# Patient Record
Sex: Male | Born: 1965 | Race: White | Hispanic: No | Marital: Married | Smoking: Never smoker
Health system: Southern US, Community
[De-identification: ages and names within clinical notes are randomized; demographics above are authoritative.]

## PROBLEM LIST (undated history)

## (undated) DIAGNOSIS — N200 Calculus of kidney: Secondary | ICD-10-CM

## (undated) DIAGNOSIS — E119 Type 2 diabetes mellitus without complications: Secondary | ICD-10-CM

## (undated) DIAGNOSIS — F419 Anxiety disorder, unspecified: Secondary | ICD-10-CM

## (undated) HISTORY — PX: VASECTOMY: SHX75

---

## 2000-09-02 ENCOUNTER — Other Ambulatory Visit: Admission: RE | Admit: 2000-09-02 | Discharge: 2000-09-02 | Payer: Self-pay | Admitting: Family Medicine

## 2008-02-21 ENCOUNTER — Emergency Department (HOSPITAL_COMMUNITY): Admission: EM | Admit: 2008-02-21 | Discharge: 2008-02-21 | Payer: Self-pay | Admitting: Emergency Medicine

## 2011-09-04 ENCOUNTER — Encounter: Payer: Self-pay | Admitting: Emergency Medicine

## 2011-09-04 ENCOUNTER — Emergency Department (INDEPENDENT_AMBULATORY_CARE_PROVIDER_SITE_OTHER): Payer: BC Managed Care – PPO

## 2011-09-04 ENCOUNTER — Emergency Department (HOSPITAL_BASED_OUTPATIENT_CLINIC_OR_DEPARTMENT_OTHER)
Admission: EM | Admit: 2011-09-04 | Discharge: 2011-09-04 | Disposition: A | Discharge status: Home / Self Care | Payer: BC Managed Care – PPO | Attending: Emergency Medicine | Admitting: Emergency Medicine

## 2011-09-04 ENCOUNTER — Other Ambulatory Visit: Payer: Self-pay

## 2011-09-04 DIAGNOSIS — J45909 Unspecified asthma, uncomplicated: Secondary | ICD-10-CM | POA: Insufficient documentation

## 2011-09-04 DIAGNOSIS — R5381 Other malaise: Secondary | ICD-10-CM

## 2011-09-04 DIAGNOSIS — E1169 Type 2 diabetes mellitus with other specified complication: Secondary | ICD-10-CM | POA: Insufficient documentation

## 2011-09-04 DIAGNOSIS — R002 Palpitations: Secondary | ICD-10-CM | POA: Insufficient documentation

## 2011-09-04 DIAGNOSIS — R5383 Other fatigue: Secondary | ICD-10-CM

## 2011-09-04 DIAGNOSIS — R739 Hyperglycemia, unspecified: Secondary | ICD-10-CM

## 2011-09-04 DIAGNOSIS — R000 Tachycardia, unspecified: Secondary | ICD-10-CM

## 2011-09-04 LAB — BASIC METABOLIC PANEL
BUN: 16 mg/dL (ref 6–23)
CO2: 26 mEq/L (ref 19–32)
Calcium: 10.3 mg/dL (ref 8.4–10.5)
Chloride: 100 mEq/L (ref 96–112)
Creatinine, Ser: 1 mg/dL (ref 0.50–1.35)
GFR calc Af Amer: >60 mL/min (ref >60)
GFR calc non Af Amer: >60 mL/min (ref >60)
Glucose, Bld: 289 mg/dL — ABNORMAL HIGH (ref 70–99)
Potassium: 3.8 mEq/L (ref 3.5–5.1)
Sodium: 139 mEq/L (ref 135–145)

## 2011-09-04 LAB — CBC
HCT: 44.1 % (ref 39.0–52.0)
Hemoglobin: 15.6 g/dL (ref 13.0–17.0)
MCH: 32 pg (ref 26.0–34.0)
MCHC: 35.4 g/dL (ref 30.0–36.0)
MCV: 90.4 fL (ref 78.0–100.0)
Platelets: 236 10*3/uL (ref 150–400)
RBC: 4.88 MIL/uL (ref 4.22–5.81)
RDW: 12.2 % (ref 11.5–15.5)
WBC: 6.4 10*3/uL (ref 4.0–10.5)

## 2011-09-04 LAB — DIFFERENTIAL
Basophils Absolute: 0.1 10*3/uL (ref 0.0–0.1)
Basophils Relative: 1 % (ref 0–1)
Eosinophils Absolute: 0.2 10*3/uL (ref 0.0–0.7)
Eosinophils Relative: 3 % (ref 0–5)
Lymphocytes Relative: 20 % (ref 12–46)
Lymphs Abs: 1.3 10*3/uL (ref 0.7–4.0)
Monocytes Absolute: 0.6 10*3/uL (ref 0.1–1.0)
Monocytes Relative: 10 % (ref 3–12)
Neutro Abs: 4.2 10*3/uL (ref 1.7–7.7)
Neutrophils Relative %: 66 % (ref 43–77)

## 2011-09-04 LAB — CARDIAC PANEL(CRET KIN+CKTOT+MB+TROPI)
Relative Index: 2.2 (ref 0.0–2.5)
Troponin I: <0.3 ng/mL (ref <0.30)

## 2011-09-04 NOTE — ED Provider Notes (Signed)
History     CSN: [id number] Arrival date & time: 09/04/2011  4:59 PM   Chief Complaint  Patient presents with  . Tachycardia     (Include location/radiation/quality/duration/timing/severity/associated sxs/prior treatment) HPI Comments: Pt states that he one episode of rapid heart rate followed by entire body flushing:pt states he then had 2 more episodes of flushing since  Patient is a 45 y.o. male presenting with palpitations. The history is provided by the patient. No language interpreter was used.  Palpitations  This is a new problem. The current episode started 3 to 5 hours ago. The problem occurs rarely. The problem has been resolved. The problem is associated with an unknown factor. On average, each episode lasts 30 seconds. Associated symptoms include weakness. Pertinent negatives include no diaphoresis, no fever, no chest pain, no chest pressure, no syncope, no abdominal pain, no cough, no shortness of breath and no sputum production. Associated symptoms comments: Pt states that he had entire body flushing. He has tried nothing for the symptoms. The treatment provided no relief. Risk factors include no known risk factors.     Past Medical History  Diagnosis Date  . Diabetes mellitus   . Asthma      Past Surgical History  Procedure Date  . Vasectomy     No family history on file.  History  Substance Use Topics  . Smoking status: Never Smoker   . Smokeless tobacco: Not on file  . Alcohol Use: No      Review of Systems  Constitutional: Negative for fever and diaphoresis.  Respiratory: Negative for cough, sputum production and shortness of breath.   Cardiovascular: Positive for palpitations. Negative for chest pain and syncope.  Gastrointestinal: Negative for abdominal pain.  Neurological: Positive for weakness.  All other systems reviewed and are negative.    Allergies  Review of patient's allergies indicates no known allergies.  Home Medications    Current Outpatient Rx  Name Route Sig Dispense Refill  . BUDESONIDE-FORMOTEROL FUMARATE 80-4.5 MCG/ACT IN AERO Inhalation Inhale 2 puffs into the lungs 2 (two) times daily.      Marland Kitchen METFORMIN HCL 1000 MG PO TABS Oral Take 1,000 mg by mouth daily.        Physical Exam    BP 140/80  Pulse 91  Temp(Src) 97.8 F (36.6 C) (Oral)  Resp 18  Ht 5\' 10"  (1.778 m)  Wt 230 lb (104.327 kg)  BMI 33.00 kg/m2  SpO2 98%  Physical Exam  Nursing note and vitals reviewed. Constitutional: He appears well-developed and well-nourished.  HENT:  Head: Normocephalic and atraumatic.  Eyes: Pupils are equal, round, and reactive to light.  Neck: Normal range of motion.  Cardiovascular: Normal rate and regular rhythm.   Pulmonary/Chest: Effort normal and breath sounds normal.  Abdominal: Soft.  Musculoskeletal: Normal range of motion.  Neurological: He is alert.  Skin: Skin is warm and dry.  Psychiatric: He has a normal mood and affect.    ED Course  Procedures  Results for orders placed during the hospital encounter of 09/04/11  BASIC METABOLIC PANEL      Component Value Range   Sodium 139  135 - 145 (mEq/L)   Potassium 3.8  3.5 - 5.1 (mEq/L)   Chloride 100  96 - 112 (mEq/L)   CO2 26  19 - 32 (mEq/L)   Glucose, Bld 289 (*) 70 - 99 (mg/dL)   BUN 16  6 - 23 (mg/dL)   Creatinine, Ser 1.47  0.50 - 1.35 (mg/dL)  Calcium 10.3  8.4 - 10.5 (mg/dL)   GFR calc non Af Amer >60  >60 (mL/min)   GFR calc Af Amer >60  >60 (mL/min)  CBC      Component Value Range   WBC 6.4  4.0 - 10.5 (K/uL)   RBC 4.88  4.22 - 5.81 (MIL/uL)   Hemoglobin 15.6  13.0 - 17.0 (g/dL)   HCT 16.1  09.6 - 04.5 (%)   MCV 90.4  78.0 - 100.0 (fL)   MCH 32.0  26.0 - 34.0 (pg)   MCHC 35.4  30.0 - 36.0 (g/dL)   RDW 40.9  81.1 - 91.4 (%)   Platelets 236  150 - 400 (K/uL)  DIFFERENTIAL      Component Value Range   Neutrophils Relative 66  43 - 77 (%)   Neutro Abs 4.2  1.7 - 7.7 (K/uL)   Lymphocytes Relative 20  12 - 46 (%)    Lymphs Abs 1.3  0.7 - 4.0 (K/uL)   Monocytes Relative 10  3 - 12 (%)   Monocytes Absolute 0.6  0.1 - 1.0 (K/uL)   Eosinophils Relative 3  0 - 5 (%)   Eosinophils Absolute 0.2  0.0 - 0.7 (K/uL)   Basophils Relative 1  0 - 1 (%)   Basophils Absolute 0.1  0.0 - 0.1 (K/uL)  CARDIAC PANEL(CRET KIN+CKTOT+MB+TROPI)      Component Value Range   Total CK 100  7 - 232 (U/L)   CK, MB 2.2  0.3 - 4.0 (ng/mL)   Troponin I <0.30  <0.30 (ng/mL)   Relative Index 2.2  0.0 - 2.5    Dg Chest 2 View  09/04/2011  *RADIOLOGY REPORT*  Clinical Data: 45 year old male with tachycardia, palpitations and weakness.  CHEST - 2 VIEW  Comparison: None  Findings: The cardiomediastinal silhouette is unremarkable. There is no evidence of focal airspace disease, pulmonary edema, pulmonary nodule/mass, pleural effusion, or pneumothorax. No acute bony abnormalities are identified.  IMPRESSION: No evidence of active cardiopulmonary disease.  Original Report Authenticated By: Rosendo Gros, M.D.    Date: 09/04/2011  Rate: 82  Rhythm: normal sinus rhythm  QRS Axis: normal  Intervals: normal  ST/T Wave abnormalities: normal  Conduction Disutrbances:none  Narrative Interpretation:   Old EKG Reviewed: none available   MDM No acute findings:discussed blood sugar with pt;pt is not having symptoms at this time:discussed following up with pcp or cardiology for possible holter monitor for continued symptoms       Teressa Lower, NP 09/04/11 1908

## 2011-09-04 NOTE — ED Provider Notes (Signed)
Medical screening examination/treatment/procedure(s) were performed by non-physician practitioner and as supervising physician I was immediately available for consultation/collaboration.   Charles B. Bernette Mayers, MD 09/04/11 1910

## 2011-09-04 NOTE — ED Notes (Signed)
Pt c/o rapid HR while getting haircut today; reports weakness in all extremities, nausea. Denies pain.

## 2011-09-09 LAB — URINALYSIS, ROUTINE W REFLEX MICROSCOPIC
Nitrite: NEGATIVE
Specific Gravity, Urine: 1.033 — ABNORMAL HIGH
Urobilinogen, UA: 0.2
pH: 5.5

## 2011-09-09 LAB — CBC: WBC: 10.8 — ABNORMAL HIGH

## 2011-09-09 LAB — BASIC METABOLIC PANEL
BUN: 19
CO2: 23
Chloride: 106
Creatinine, Ser: 1.32
Glucose, Bld: 208 — ABNORMAL HIGH
Potassium: 4.1

## 2011-09-09 LAB — URINE MICROSCOPIC-ADD ON

## 2011-09-09 LAB — DIFFERENTIAL
Lymphocytes Relative: 24
Monocytes Absolute: 0.5
Monocytes Relative: 5
Neutro Abs: 7.5

## 2011-10-11 ENCOUNTER — Other Ambulatory Visit: Payer: Self-pay | Admitting: Family Medicine

## 2011-10-11 ENCOUNTER — Ambulatory Visit
Admission: RE | Admit: 2011-10-11 | Discharge: 2011-10-11 | Disposition: A | Discharge status: Home / Self Care | Payer: BC Managed Care – PPO | Source: Ambulatory Visit | Attending: Family Medicine | Admitting: Family Medicine

## 2011-10-11 DIAGNOSIS — M79669 Pain in unspecified lower leg: Secondary | ICD-10-CM

## 2011-12-17 HISTORY — PX: EXTRACORPOREAL SHOCK WAVE LITHOTRIPSY: SHX1557

## 2011-12-17 HISTORY — PX: CYSTOSCOPY WITH STENT PLACEMENT: SHX5790

## 2012-07-06 DIAGNOSIS — E119 Type 2 diabetes mellitus without complications: Secondary | ICD-10-CM | POA: Insufficient documentation

## 2012-07-06 DIAGNOSIS — N200 Calculus of kidney: Secondary | ICD-10-CM | POA: Insufficient documentation

## 2012-07-06 DIAGNOSIS — J45909 Unspecified asthma, uncomplicated: Secondary | ICD-10-CM | POA: Insufficient documentation

## 2012-07-06 DIAGNOSIS — N201 Calculus of ureter: Secondary | ICD-10-CM | POA: Insufficient documentation

## 2012-07-06 NOTE — ED Notes (Signed)
Pt. Reports he usually takes flowmax and tonight he vomited up the medicine.

## 2012-07-07 ENCOUNTER — Emergency Department (HOSPITAL_BASED_OUTPATIENT_CLINIC_OR_DEPARTMENT_OTHER): Payer: BC Managed Care – PPO

## 2012-07-07 ENCOUNTER — Encounter (HOSPITAL_BASED_OUTPATIENT_CLINIC_OR_DEPARTMENT_OTHER): Payer: Self-pay | Admitting: *Deleted

## 2012-07-07 ENCOUNTER — Emergency Department (HOSPITAL_BASED_OUTPATIENT_CLINIC_OR_DEPARTMENT_OTHER)
Admission: EM | Admit: 2012-07-07 | Discharge: 2012-07-07 | Disposition: A | Discharge status: Home / Self Care | Payer: BC Managed Care – PPO | Attending: Emergency Medicine | Admitting: Emergency Medicine

## 2012-07-07 DIAGNOSIS — N201 Calculus of ureter: Secondary | ICD-10-CM

## 2012-07-07 DIAGNOSIS — N200 Calculus of kidney: Secondary | ICD-10-CM

## 2012-07-07 DIAGNOSIS — N23 Unspecified renal colic: Secondary | ICD-10-CM

## 2012-07-07 LAB — CBC WITH DIFFERENTIAL/PLATELET
Basophils Relative: 1 % (ref 0–1)
Eosinophils Absolute: 0.5 10*3/uL (ref 0.0–0.7)
HCT: 44.1 % (ref 39.0–52.0)
Hemoglobin: 15.2 g/dL (ref 13.0–17.0)
MCH: 31.7 pg (ref 26.0–34.0)
MCHC: 34.5 g/dL (ref 30.0–36.0)
Monocytes Absolute: 0.8 10*3/uL (ref 0.1–1.0)
Monocytes Relative: 9 % (ref 3–12)
Neutro Abs: 4.7 10*3/uL (ref 1.7–7.7)

## 2012-07-07 LAB — URINE MICROSCOPIC-ADD ON

## 2012-07-07 LAB — BASIC METABOLIC PANEL
BUN: 19 mg/dL (ref 6–23)
Chloride: 101 mEq/L (ref 96–112)
Creatinine, Ser: 1.3 mg/dL (ref 0.50–1.35)
GFR calc Af Amer: 75 mL/min — ABNORMAL LOW (ref >90)
Glucose, Bld: 192 mg/dL — ABNORMAL HIGH (ref 70–99)

## 2012-07-07 LAB — URINALYSIS, ROUTINE W REFLEX MICROSCOPIC
Bilirubin Urine: NEGATIVE
Ketones, ur: NEGATIVE mg/dL
Nitrite: NEGATIVE
pH: 6 (ref 5.0–8.0)

## 2012-07-07 MED ORDER — HYDROMORPHONE HCL PF 1 MG/ML IJ SOLN
1.0000 mg | Freq: Once | INTRAMUSCULAR | Status: AC
  Administered 2012-07-07: 1 mg via INTRAVENOUS
  Filled 2012-07-07: qty 1

## 2012-07-07 MED ORDER — ONDANSETRON HCL 4 MG/2ML IJ SOLN
INTRAMUSCULAR | Status: AC
  Administered 2012-07-07: 4 mg
  Filled 2012-07-07: qty 2

## 2012-07-07 MED ORDER — KETOROLAC TROMETHAMINE 30 MG/ML IJ SOLN
30.0000 mg | Freq: Once | INTRAMUSCULAR | Status: AC
  Administered 2012-07-07: 30 mg via INTRAVENOUS
  Filled 2012-07-07: qty 1

## 2012-07-07 MED ORDER — TAMSULOSIN HCL 0.4 MG PO CAPS: 0.4000 mg | ORAL_CAPSULE | Freq: Every day | ORAL | Status: DC

## 2012-07-07 MED ORDER — SODIUM CHLORIDE 0.9 % IV SOLN
Freq: Once | INTRAVENOUS | Status: AC
  Administered 2012-07-07: 1000 mL via INTRAVENOUS

## 2012-07-07 MED ORDER — ONDANSETRON HCL 4 MG/2ML IJ SOLN
4.0000 mg | Freq: Once | INTRAMUSCULAR | Status: AC
  Administered 2012-07-07: 4 mg via INTRAVENOUS
  Filled 2012-07-07: qty 2

## 2012-07-07 MED ORDER — METOCLOPRAMIDE HCL 10 MG PO TABS: 10.0000 mg | ORAL_TABLET | Freq: Four times a day (QID) | ORAL | Status: DC | PRN

## 2012-07-07 MED ORDER — SODIUM CHLORIDE 0.9 % IV BOLUS (SEPSIS)
1000.0000 mL | Freq: Once | INTRAVENOUS | Status: AC
  Administered 2012-07-07: 1000 mL via INTRAVENOUS

## 2012-07-07 MED ORDER — HYDROMORPHONE HCL 2 MG PO TABS: 2.0000 mg | ORAL_TABLET | ORAL | Status: AC | PRN

## 2012-07-07 MED ORDER — NAPROXEN 500 MG PO TABS: 500.0000 mg | ORAL_TABLET | Freq: Two times a day (BID) | ORAL | Status: AC

## 2012-07-07 NOTE — ED Notes (Signed)
Patient transported to CT 

## 2012-07-07 NOTE — ED Provider Notes (Signed)
History   This chart was scribed for Dione Booze, MD by Luke Rodriguez. The patient was seen in room MH11/MH11. Patient's care was started at 2349.     CSN: [id number]  Arrival date & time 07/06/12  2349   First MD Initiated Contact with Patient 07/07/12 0009      Chief Complaint  Patient presents with  . Flank Pain    Pt. reports he has pain in the L low back.  Pt. reports multiple kidney stones.  Pt.reports his father brought him here.    (Consider location/radiation/quality/duration/timing/severity/associated sxs/prior treatment) Patient is a 45 y.o. male presenting with flank pain. The history is provided by the patient. No language interpreter was used.  Flank Pain This is a new problem. The current episode started 1 to 2 hours ago. The problem occurs constantly. The problem has not changed since onset.Pertinent negatives include no chest pain, no abdominal pain, no headaches and no shortness of breath. Treatments tried: Hydrocodone and Flowmax. The treatment provided no relief.   Luke Rodriguez is a 46 y.o. male who presents to the Emergency Department complaining of moderate to severe, constant left flank pain onset 1.75 hours ago.  Associated symptoms include diaphoresis, nausea and vomiting. Patient states that pain is non-radiating. Patient rates pain as 8/10 now, but maximum was 10/10. Patient took Flowmax, but he vomited it up. Patient also took 2 Hydrocodone 325mg   but he is not sure if he vomited those up too. Patient with medical h/o diabetes, asthma, renal disorder and vasectomy. Patient has never smoked.  PCP - Nicholos Johns Past Medical History  Diagnosis Date  . Diabetes mellitus   . Asthma   . Renal disorder     Past Surgical History  Procedure Date  . Vasectomy     No family history on file.  History  Substance Use Topics  . Smoking status: Never Smoker   . Smokeless tobacco: Not on file  . Alcohol Use: No      Review of Systems  Constitutional: Positive  for diaphoresis.  Respiratory: Negative for shortness of breath.   Cardiovascular: Negative for chest pain.  Gastrointestinal: Positive for nausea and vomiting. Negative for abdominal pain.  Genitourinary: Positive for flank pain.  Neurological: Negative for headaches.  All other systems reviewed and are negative.    Allergies  Review of patient's allergies indicates no known allergies.  Home Medications   Current Outpatient Rx  Name Route Sig Dispense Refill  . BUDESONIDE-FORMOTEROL FUMARATE 80-4.5 MCG/ACT IN AERO Inhalation Inhale 2 puffs into the lungs 2 (two) times daily.      Marland Kitchen METFORMIN HCL 1000 MG PO TABS Oral Take 1,000 mg by mouth daily.        BP 154/75  Pulse 80  Temp 98.7 F (37.1 C) (Oral)  Resp 16  Ht 5\' 10"  (1.778 m)  Wt 220 lb (99.791 kg)  BMI 31.57 kg/m2  SpO2 98%  Physical Exam  Nursing note and vitals reviewed. Constitutional: He is oriented to person, place, and time. He appears well-developed and well-nourished.       Appears uncomfortable.  HENT:  Head: Normocephalic and atraumatic.  Eyes: Conjunctivae and EOM are normal. Pupils are equal, round, and reactive to light.  Neck: Normal range of motion. Neck supple.  Cardiovascular: Normal rate and regular rhythm.   Pulmonary/Chest: Effort normal and breath sounds normal.  Abdominal: Soft. Bowel sounds are decreased. There is CVA tenderness (Mild left).  Musculoskeletal: Normal range of motion.  Neurological: He is  alert and oriented to person, place, and time.  Skin: Skin is warm and dry.  Psychiatric: He has a normal mood and affect.    ED Course  Procedures (including critical care time) DIAGNOSTIC STUDIES: Oxygen Saturation is 98% on room air, normal by my interpretation.    COORDINATION OF CARE: 12:11am- Patient informed of current plan for treatment and evaluation and agrees with plan at this time. Have reviewed CT scan of abdomen from 2009. There were small stones present in both kidneys.  Patient is at risk for kidney stones. Will administer fluids, Zofran and pain medication by IV. Will order a repeat CT of abdomen/pelvis, CBC, basic metabolic panel and urinalysis.  Results for orders placed during the hospital encounter of 07/07/12  CBC WITH DIFFERENTIAL      Component Value Range   WBC 9.1  4.0 - 10.5 K/uL   RBC 4.79  4.22 - 5.81 MIL/uL   Hemoglobin 15.2  13.0 - 17.0 g/dL   HCT 62.1  30.8 - 65.7 %   MCV 92.1  78.0 - 100.0 fL   MCH 31.7  26.0 - 34.0 pg   MCHC 34.5  30.0 - 36.0 g/dL   RDW 84.6  96.2 - 95.2 %   Platelets 273  150 - 400 K/uL   Neutrophils Relative 52  43 - 77 %   Neutro Abs 4.7  1.7 - 7.7 K/uL   Lymphocytes Relative 33  12 - 46 %   Lymphs Abs 3.0  0.7 - 4.0 K/uL   Monocytes Relative 9  3 - 12 %   Monocytes Absolute 0.8  0.1 - 1.0 K/uL   Eosinophils Relative 6 (*) 0 - 5 %   Eosinophils Absolute 0.5  0.0 - 0.7 K/uL   Basophils Relative 1  0 - 1 %   Basophils Absolute 0.1  0.0 - 0.1 K/uL  BASIC METABOLIC PANEL      Component Value Range   Sodium 142  135 - 145 mEq/L   Potassium 3.9  3.5 - 5.1 mEq/L   Chloride 101  96 - 112 mEq/L   CO2 27  19 - 32 mEq/L   Glucose, Bld 192 (*) 70 - 99 mg/dL   BUN 19  6 - 23 mg/dL   Creatinine, Ser 8.41  0.50 - 1.35 mg/dL   Calcium 9.7  8.4 - 32.4 mg/dL   GFR calc non Af Amer 64 (*) >90 mL/min   GFR calc Af Amer 75 (*) >90 mL/min  URINALYSIS, ROUTINE W REFLEX MICROSCOPIC      Component Value Range   Color, Urine AMBER (*) YELLOW   APPearance CLOUDY (*) CLEAR   Specific Gravity, Urine 1.027  1.005 - 1.030   pH 6.0  5.0 - 8.0   Glucose, UA NEGATIVE  NEGATIVE mg/dL   Hgb urine dipstick LARGE (*) NEGATIVE   Bilirubin Urine NEGATIVE  NEGATIVE   Ketones, ur NEGATIVE  NEGATIVE mg/dL   Protein, ur 401 (*) NEGATIVE mg/dL   Urobilinogen, UA 0.2  0.0 - 1.0 mg/dL   Nitrite NEGATIVE  NEGATIVE   Leukocytes, UA TRACE (*) NEGATIVE  URINE MICROSCOPIC-ADD ON      Component Value Range   Squamous Epithelial / LPF RARE  RARE     WBC, UA 3-6  <3 WBC/hpf   RBC / HPF TOO NUMEROUS TO COUNT  <3 RBC/hpf   Bacteria, UA FEW (*) RARE   Crystals CA OXALATE CRYSTALS (*) NEGATIVE   Ct Abdomen Pelvis Wo Contrast  07/07/2012  *RADIOLOGY REPORT*  Clinical Data: Left lower back pain and history of renal calculi.  CT ABDOMEN AND PELVIS WITHOUT CONTRAST  Technique:  Multidetector CT imaging of the abdomen and pelvis was performed following the standard protocol without intravenous contrast.  Comparison: 02/21/2008  Findings: There is evidence of mild left-sided hydronephrosis. Within the proximal ureter, just below the UPJ, a calculus is present measuring approximately 5-6 mm.  Nonobstructing calculus is present in the right renal pelvis measuring approximately 10 mm.  Additional lower pole calculus in the right kidney measures 7 mm.  Unenhanced appearance of other solid organs of the abdomen and pelvis are unremarkable.  No bladder calculi identified.  No hernias.  No masses or enlarged lymph nodes.  Bony structures are unremarkable.  IMPRESSION:  1.  Proximal ureteral calculus on the left measure approximately 5- 6 mm and causes mild hydronephrosis. 2.  Two separate calculi in the right kidney with a 7 mm lower pole calculus present and a 10 mm pelvic calculus present.  Original Report Authenticated By: Reola Calkins, M.D.    Images viewed by me.   1. Ureterolithiasis   2. Ureteral colic   3. Nephrolithiasis       MDM  Ureterolithiasis. You'll be sent for CT scan to evaluate size and location of stone. Old charts are reviewed and he had a prior ED visit for her ureterolithiasis several years ago and small renal calculi were noted to be present at that time. He'll be treated with IV fluids, hydromorphone, and ondansetron.  Pain was somewhat difficult to control. He required 3 mg of hydromorphone to get adequate pain relief. Calculus is fairly high up and the size that makes it more likely embedded will need some kind of  intervention. He is sent home with prescriptions for hydromorphone, metoclopramide, naproxen, and tamsulosin and referred to the on-call urologist.  I personally performed the services described in this documentation, which was scribed in my presence. The recorded information has been reviewed and considered.      Dione Booze, MD 07/07/12 [phone]

## 2015-02-23 ENCOUNTER — Observation Stay (HOSPITAL_BASED_OUTPATIENT_CLINIC_OR_DEPARTMENT_OTHER)
Admission: EM | Admit: 2015-02-23 | Discharge: 2015-02-24 | Disposition: A | Discharge status: Home / Self Care | Payer: BLUE CROSS/BLUE SHIELD | Attending: Internal Medicine | Admitting: Internal Medicine

## 2015-02-23 ENCOUNTER — Emergency Department (HOSPITAL_BASED_OUTPATIENT_CLINIC_OR_DEPARTMENT_OTHER): Payer: BLUE CROSS/BLUE SHIELD

## 2015-02-23 ENCOUNTER — Encounter (HOSPITAL_BASED_OUTPATIENT_CLINIC_OR_DEPARTMENT_OTHER): Payer: Self-pay | Admitting: *Deleted

## 2015-02-23 DIAGNOSIS — I10 Essential (primary) hypertension: Secondary | ICD-10-CM | POA: Diagnosis not present

## 2015-02-23 DIAGNOSIS — J45909 Unspecified asthma, uncomplicated: Secondary | ICD-10-CM

## 2015-02-23 DIAGNOSIS — R079 Chest pain, unspecified: Principal | ICD-10-CM | POA: Diagnosis present

## 2015-02-23 DIAGNOSIS — Z794 Long term (current) use of insulin: Secondary | ICD-10-CM | POA: Diagnosis not present

## 2015-02-23 DIAGNOSIS — E119 Type 2 diabetes mellitus without complications: Secondary | ICD-10-CM | POA: Insufficient documentation

## 2015-02-23 DIAGNOSIS — J4520 Mild intermittent asthma, uncomplicated: Secondary | ICD-10-CM

## 2015-02-23 DIAGNOSIS — R0789 Other chest pain: Secondary | ICD-10-CM

## 2015-02-23 HISTORY — DX: Anxiety disorder, unspecified: F41.9

## 2015-02-23 HISTORY — DX: Type 2 diabetes mellitus without complications: E11.9

## 2015-02-23 HISTORY — DX: Calculus of kidney: N20.0

## 2015-02-23 LAB — BASIC METABOLIC PANEL
ANION GAP: 11 (ref 5–15)
BUN: 18 mg/dL (ref 6–23)
CHLORIDE: 104 mmol/L (ref 96–112)
CO2: 24 mmol/L (ref 19–32)
CREATININE: 1 mg/dL (ref 0.50–1.35)
Calcium: 9.6 mg/dL (ref 8.4–10.5)
GFR calc Af Amer: >90 mL/min (ref >90)
GFR calc non Af Amer: 87 mL/min — ABNORMAL LOW (ref >90)
Glucose, Bld: 146 mg/dL — ABNORMAL HIGH (ref 70–99)
POTASSIUM: 3.5 mmol/L (ref 3.5–5.1)
SODIUM: 139 mmol/L (ref 135–145)

## 2015-02-23 LAB — CBC WITH DIFFERENTIAL/PLATELET
BASOS PCT: 1 % (ref 0–1)
Basophils Absolute: 0.1 10*3/uL (ref 0.0–0.1)
Eosinophils Absolute: 0.1 10*3/uL (ref 0.0–0.7)
Eosinophils Relative: 1 % (ref 0–5)
HEMATOCRIT: 48 % (ref 39.0–52.0)
Hemoglobin: 16.2 g/dL (ref 13.0–17.0)
Lymphocytes Relative: 22 % (ref 12–46)
Lymphs Abs: 1.4 10*3/uL (ref 0.7–4.0)
MCH: 32 pg (ref 26.0–34.0)
MCHC: 33.8 g/dL (ref 30.0–36.0)
MCV: 94.7 fL (ref 78.0–100.0)
Monocytes Absolute: 0.6 10*3/uL (ref 0.1–1.0)
Monocytes Relative: 9 % (ref 3–12)
NEUTROS ABS: 4.3 10*3/uL (ref 1.7–7.7)
NEUTROS PCT: 67 % (ref 43–77)
Platelets: 256 10*3/uL (ref 150–400)
RBC: 5.07 MIL/uL (ref 4.22–5.81)
RDW: 12.4 % (ref 11.5–15.5)
WBC: 6.4 10*3/uL (ref 4.0–10.5)

## 2015-02-23 LAB — GLUCOSE, CAPILLARY: Glucose-Capillary: 185 mg/dL — ABNORMAL HIGH (ref 70–99)

## 2015-02-23 LAB — CBG MONITORING, ED: GLUCOSE-CAPILLARY: 86 mg/dL (ref 70–99)

## 2015-02-23 LAB — TROPONIN I: Troponin I: <0.03 ng/mL (ref <0.031)

## 2015-02-23 LAB — TSH: TSH: 1.611 u[IU]/mL (ref 0.350–4.500)

## 2015-02-23 MED ORDER — ADULT MULTIVITAMIN W/MINERALS CH
1.0000 | ORAL_TABLET | Freq: Every day | ORAL | Status: DC
  Administered 2015-02-24: 1 via ORAL
  Filled 2015-02-23: qty 1

## 2015-02-23 MED ORDER — ONDANSETRON HCL 4 MG PO TABS: 4.0000 mg | ORAL_TABLET | Freq: Four times a day (QID) | ORAL | Status: DC | PRN

## 2015-02-23 MED ORDER — METOCLOPRAMIDE HCL 10 MG PO TABS: 10.0000 mg | ORAL_TABLET | Freq: Four times a day (QID) | ORAL | Status: DC | PRN

## 2015-02-23 MED ORDER — INSULIN ASPART 100 UNIT/ML ~~LOC~~ SOLN: 0.0000 [IU] | Freq: Three times a day (TID) | SUBCUTANEOUS | Status: DC

## 2015-02-23 MED ORDER — TAMSULOSIN HCL 0.4 MG PO CAPS: 0.4000 mg | ORAL_CAPSULE | Freq: Every day | ORAL | Status: DC

## 2015-02-23 MED ORDER — MORPHINE SULFATE 2 MG/ML IJ SOLN: 2.0000 mg | INTRAMUSCULAR | Status: DC | PRN

## 2015-02-23 MED ORDER — ACETAMINOPHEN 650 MG RE SUPP: 650.0000 mg | Freq: Four times a day (QID) | RECTAL | Status: DC | PRN

## 2015-02-23 MED ORDER — VITAMIN B-1 100 MG PO TABS
100.0000 mg | ORAL_TABLET | Freq: Every day | ORAL | Status: DC
  Administered 2015-02-24: 100 mg via ORAL
  Filled 2015-02-23: qty 1

## 2015-02-23 MED ORDER — ONDANSETRON HCL 4 MG/2ML IJ SOLN: 4.0000 mg | Freq: Four times a day (QID) | INTRAMUSCULAR | Status: DC | PRN

## 2015-02-23 MED ORDER — SODIUM CHLORIDE 0.9 % IJ SOLN
3.0000 mL | Freq: Two times a day (BID) | INTRAMUSCULAR | Status: DC
  Administered 2015-02-23 – 2015-02-24 (×2): 3 mL via INTRAVENOUS

## 2015-02-23 MED ORDER — FLUTICASONE PROPIONATE HFA 44 MCG/ACT IN AERO
2.0000 | INHALATION_SPRAY | Freq: Two times a day (BID) | RESPIRATORY_TRACT | Status: DC | PRN
Start: 2015-02-23 — End: 2015-02-24
  Filled 2015-02-23: qty 10.6

## 2015-02-23 MED ORDER — LINAGLIPTIN 5 MG PO TABS
5.0000 mg | ORAL_TABLET | Freq: Every day | ORAL | Status: DC
  Administered 2015-02-24: 5 mg via ORAL
  Filled 2015-02-23: qty 1

## 2015-02-23 MED ORDER — ACETAMINOPHEN 325 MG PO TABS: 650.0000 mg | ORAL_TABLET | Freq: Four times a day (QID) | ORAL | Status: DC | PRN

## 2015-02-23 MED ORDER — ZOLPIDEM TARTRATE 5 MG PO TABS
5.0000 mg | ORAL_TABLET | Freq: Every evening | ORAL | Status: DC | PRN
  Filled 2015-02-23: qty 1

## 2015-02-23 MED ORDER — SAXAGLIPTIN-METFORMIN ER 2.5-1000 MG PO TB24: 2.0000 | ORAL_TABLET | Freq: Every day | ORAL | Status: DC

## 2015-02-23 MED ORDER — ASPIRIN EC 325 MG PO TBEC
325.0000 mg | DELAYED_RELEASE_TABLET | Freq: Every day | ORAL | Status: DC
  Administered 2015-02-24: 325 mg via ORAL
  Filled 2015-02-23: qty 1

## 2015-02-23 MED ORDER — CANAGLIFLOZIN 300 MG PO TABS
300.0000 mg | ORAL_TABLET | Freq: Every day | ORAL | Status: DC
  Administered 2015-02-24: 300 mg via ORAL
  Filled 2015-02-23: qty 300

## 2015-02-23 MED ORDER — SODIUM CHLORIDE 0.9 % IJ SOLN: 3.0000 mL | INTRAMUSCULAR | Status: DC | PRN

## 2015-02-23 MED ORDER — METFORMIN HCL ER 500 MG PO TB24
1000.0000 mg | ORAL_TABLET | Freq: Every day | ORAL | Status: DC
  Administered 2015-02-24: 1000 mg via ORAL
  Filled 2015-02-23 (×2): qty 2

## 2015-02-23 MED ORDER — HEPARIN SODIUM (PORCINE) 5000 UNIT/ML IJ SOLN
5000.0000 [IU] | Freq: Three times a day (TID) | INTRAMUSCULAR | Status: DC
  Administered 2015-02-23 – 2015-02-24 (×2): 5000 [IU] via SUBCUTANEOUS
  Filled 2015-02-23 (×2): qty 1

## 2015-02-23 MED ORDER — FOLIC ACID 1 MG PO TABS
1.0000 mg | ORAL_TABLET | Freq: Every day | ORAL | Status: DC
  Administered 2015-02-24: 1 mg via ORAL
  Filled 2015-02-23: qty 1

## 2015-02-23 MED ORDER — SODIUM CHLORIDE 0.9 % IV SOLN: 250.0000 mL | INTRAVENOUS | Status: DC | PRN

## 2015-02-23 MED ORDER — ALBUTEROL SULFATE (2.5 MG/3ML) 0.083% IN NEBU: 2.5000 mg | INHALATION_SOLUTION | Freq: Two times a day (BID) | RESPIRATORY_TRACT | Status: DC | PRN

## 2015-02-23 MED ORDER — ASPIRIN 81 MG PO CHEW
324.0000 mg | CHEWABLE_TABLET | Freq: Once | ORAL | Status: AC
  Administered 2015-02-23: 324 mg via ORAL
  Filled 2015-02-23: qty 4

## 2015-02-23 MED ORDER — CITALOPRAM HYDROBROMIDE 20 MG PO TABS
20.0000 mg | ORAL_TABLET | Freq: Every day | ORAL | Status: DC
  Administered 2015-02-24: 20 mg via ORAL
  Filled 2015-02-23: qty 1

## 2015-02-23 NOTE — ED Provider Notes (Signed)
CSN: [id number]     Arrival date & time 02/23/15  1330 History   First MD Initiated Contact with Patient 02/23/15 1354     Chief Complaint  Patient presents with  . Chest Pain     (Consider location/radiation/quality/duration/timing/severity/associated sxs/prior Treatment) HPI  This a 49 year old male with a history of diabetes, hypertension who presents with chest pain. Patient states that earlier this morning he felt like his blood sugar was low. He had symptoms of feeling clammy. He ate some M&M's and orange juice and when he was able to check his blood sugar was in the 280s following eating. He states he was persistently clammy and then developed adult chest pain approximately 3 hours prior to arrival. He reports the sternal and nonradiating. Denies any associated shortness of breath. He was not exerting himself when the pain came on. Currently he is chest pain-free. He reports that he had a stress test in 2015 ordered by his primary doctor.    Review of [facility] with chest pain rule out and troponin 3 negative in November 2015.  Past Medical History  Diagnosis Date  . Diabetes mellitus   . Asthma   . Renal disorder    Past Surgical History  Procedure Laterality Date  . Vasectomy     No family history on file. History  Substance Use Topics  . Smoking status: Never Smoker   . Smokeless tobacco: Not on file  . Alcohol Use: No    Review of Systems  Constitutional: Positive for diaphoresis. Negative for fever.  Respiratory: Positive for chest tightness. Negative for shortness of breath.   Cardiovascular: Positive for chest pain.  Gastrointestinal: Negative.  Negative for abdominal pain.  Genitourinary: Negative.  Negative for dysuria.  Musculoskeletal: Negative for back pain.  Neurological: Negative for headaches.  All other systems reviewed and are negative.     Allergies  Review of patient's allergies indicates no known allergies.  Home  Medications   Prior to Admission medications   Medication Sig Start Date End Date Taking? Authorizing Provider  Canagliflozin (INVOKANA PO) Take by mouth.   Yes Historical Provider, MD  citalopram (CELEXA) 20 MG tablet Take 20 mg by mouth daily.   Yes Historical Provider, MD  insulin glargine (LANTUS) 100 UNIT/ML injection Inject into the skin at bedtime.   Yes Historical Provider, MD  Saxagliptin-Metformin (KOMBIGLYZE XR PO) Take by mouth.   Yes Historical Provider, MD  budesonide-formoterol (SYMBICORT) 80-4.5 MCG/ACT inhaler Inhale 2 puffs into the lungs 2 (two) times daily.      Historical Provider, MD  metFORMIN (GLUCOPHAGE) 1000 MG tablet Take 1,000 mg by mouth daily.      Historical Provider, MD  metoCLOPramide (REGLAN) 10 MG tablet Take 1 tablet (10 mg total) by mouth every 6 (six) hours as needed (nausea). 07/07/12 07/17/12  Dione Boozeavid Glick, MD  Tamsulosin HCl (FLOMAX) 0.4 MG CAPS Take 1 capsule (0.4 mg total) by mouth daily. 07/07/12   Dione Boozeavid Glick, MD   BP 156/73 mmHg  Pulse 68  Temp(Src) 98.3 F (36.8 C) (Oral)  Resp 18  Ht 5\' 10"  (1.778 m)  Wt 210 lb (95.255 kg)  BMI 30.13 kg/m2  SpO2 97% Physical Exam  Constitutional: He is oriented to person, place, and time. He appears well-developed and well-nourished. No distress.  HENT:  Head: Normocephalic and atraumatic.  Eyes: Pupils are equal, round, and reactive to light.  Cardiovascular: Normal rate, regular rhythm and normal heart sounds.   No murmur  heard. Pulmonary/Chest: Effort normal and breath sounds normal. No respiratory distress. He has no wheezes. He exhibits no tenderness.  Abdominal: Soft. Bowel sounds are normal. There is no tenderness. There is no rebound.  Musculoskeletal: He exhibits no edema.  Lymphadenopathy:    He has no cervical adenopathy.  Neurological: He is alert and oriented to person, place, and time.  Skin: Skin is warm and dry.  Psychiatric: He has a normal mood and affect.  Nursing note and vitals  reviewed.   ED Course  Procedures (including critical care time) Labs Review Labs Reviewed  BASIC METABOLIC PANEL - Abnormal; Notable for the following:    Glucose, Bld 146 (*)    GFR calc non Af Amer 87 (*)    All other components within normal limits  CBC WITH DIFFERENTIAL/PLATELET  TROPONIN I    Imaging Review Dg Chest 2 View  02/23/2015   CLINICAL DATA:  Chest pain starting this morning  EXAM: CHEST  2 VIEW  COMPARISON:  09/04/2011  FINDINGS: Cardiomediastinal silhouette is stable. No acute infiltrate or pleural effusion. No pulmonary edema. Bony thorax is unremarkable.  IMPRESSION: No active cardiopulmonary disease.   Electronically Signed   By: Natasha Mead M.D.   On: 02/23/2015 14:25     EKG Interpretation   Date/Time:  Thursday February 23 2015 13:37:59 EST Ventricular Rate:  63 PR Interval:  216 QRS Duration: 88 QT Interval:  422 QTC Calculation: 431 R Axis:   85 Text Interpretation:  Sinus rhythm with 1st degree A-V block Otherwise  normal ECG Confirmed by Anjel Pardo  MD, Margaruite Top ([id number]) on 02/23/2015 2:49:33  PM      MDM   Final diagnoses:  Other chest pain   Patient presents with chest pain and an episode of diaphoresis this morning. Currently symptom-free. Patient was given full dose aspirin. EKG is nonischemic with a first-degree AV block.  Heart score for ACS is certainly a consideration. Chest x-ray shows no evidence of pneumothorax or pneumonia. He remains chest pain-free while in the emergency room. Given heart score of 4 will admit for serial enzymes. Patient will likely need cardiology evaluation.     Shon Baton, MD 02/23/15 [phone]

## 2015-02-23 NOTE — ED Notes (Signed)
Patient transported to and from x-ray.

## 2015-02-23 NOTE — ED Notes (Signed)
This am felt like his BS was low. Ran out of Lantus 2 days ago. Pressure in his chest and burning sensation at times. He gets sweaty when the sensation comes.

## 2015-02-23 NOTE — ED Notes (Signed)
[facility] is aware of room 3W10C--spoke with Bjorn Loserhonda

## 2015-02-23 NOTE — ED Notes (Signed)
Asked Pt. About taking Insulin due to note saying he takes lantus insulin.  Pt. Reports he takes no insulin only oral medicine.  Pt. Uses Lancets to check his insulin no Lantus insulin.

## 2015-02-23 NOTE — H&P (Signed)
[facility] History and Physical  Luke Rodriguez JXB:[id number] DOB: 09/12/1966 DOA: 02/23/2015  Referring physician: Ross Marcus, MD PCP: Lolita Patella, MD   Chief Complaint: Chest Pain  HPI: Luke Rodriguez is a 49 y.o. male presents with chest pain. Patient states that for the last 6-8 months hhe has noted some irregular cardiac beating. He states that he has also felt hot when this occurs. Patient states taht he has had a workup as an outpatient which shows negative stress test. He did not have a cath done. These studies were done at [facility]. Patient states today he started to feel as though his sugar was low. He states that he did not feel good after this episode. He states that he felt cold and clammy with sweating. He states that he also had some pain in his chest. Patient states he had the pain about 1/10 and also had a sensation of burning in the chest. Patient states it started in the center and went across his chest. He was concerned because of history of diabetes and so he went to the ED. He states that currently he is pain free. It seems that the pain went away by itself. He states that he took some ASA at home prior to going to the ED.   Review of Systems:  Constitutional:  No weight loss, night sweats, Fevers, chills, fatigue.  HEENT:  No headaches, No sneezing, itching, ear ache, nasal congestion, post nasal drip,  Cardio-vascular:  ++chest pain, no Orthopnea, PND, no swelling in lower extremities, dizziness, ++palpitations  GI:  No heartburn, indigestion, abdominal pain, nausea, vomiting, diarrhea  Resp:  No shortness of breath with exertion or at rest. no productive cough, No coughing up of blood.No change in color of mucus.No wheezing Skin:  no rash or lesions.  GU:  no dysuria, change in color of urine.  Musculoskeletal:  No joint pain or swelling. No decreased range of motion.  Psych:  No change in mood or affect. No depression or anxiety.    Past Medical History  Diagnosis Date  . Diabetes mellitus   . Asthma   . Renal disorder    Past Surgical History  Procedure Laterality Date  . Vasectomy     Social History:  reports that he has never smoked. He does not have any smokeless tobacco history on file. He reports that he does not drink alcohol or use illicit drugs.  Allergies  Allergen Reactions  . Budesonide-Formoterol Fumarate Palpitations    Other reaction(s): Other (See Comments) Irregular heart rate    No family history on file.   Prior to Admission medications   Medication Sig Start Date End Date Taking? Authorizing Provider  albuterol (PROVENTIL HFA;VENTOLIN HFA) 108 (90 BASE) MCG/ACT inhaler Inhale 1-2 puffs into the lungs 2 (two) times daily as needed for wheezing or shortness of breath.    Yes Historical Provider, MD  citalopram (CELEXA) 20 MG tablet Take 20 mg by mouth daily.   Yes Historical Provider, MD  insulin glargine (LANTUS) 100 UNIT/ML injection Inject into the skin at bedtime.   Yes Historical Provider, MD  INVOKANA 300 MG TABS tablet Take 300 mg by mouth daily. 02/22/15  Yes Historical Provider, MD  KOMBIGLYZE XR 2.04-999 MG TB24 Take 2 tablets by mouth daily.  02/22/15  Yes Historical Provider, MD  mometasone (ASMANEX) 220 MCG/INH inhaler Inhale 1 puff into the lungs daily.  08/24/12  Yes Historical Provider, MD  budesonide-formoterol (SYMBICORT) 80-4.5 MCG/ACT inhaler Inhale 2 puffs into the  lungs 2 (two) times daily.      Historical Provider, MD  Lancets Letta Pate(ONETOUCH ULTRASOFT) lancets  02/22/15   Historical Provider, MD  metFORMIN (GLUCOPHAGE) 1000 MG tablet Take 1,000 mg by mouth daily.      Historical Provider, MD  metoCLOPramide (REGLAN) 10 MG tablet Take 1 tablet (10 mg total) by mouth every 6 (six) hours as needed (nausea). 07/07/12 07/17/12  Dione Boozeavid Glick, MD  ONE TOUCH ULTRA TEST test strip  02/22/15   Historical Provider, MD  Tamsulosin HCl (FLOMAX) 0.4 MG CAPS Take 1 capsule (0.4 mg total) by mouth  daily. Patient not taking: Reported on 02/23/2015 07/07/12   Dione Boozeavid Glick, MD   Physical Exam: Filed Vitals:   02/23/15 1600 02/23/15 1630 02/23/15 1852 02/23/15 2036  BP: 121/67 129/65 135/72 135/63  Pulse: 59 56 61 66  Temp:   98.2 F (36.8 C) 98.4 F (36.9 C)  TempSrc:   Oral Oral  Resp: 14 13 17 18   Height:   5\' 10"  (1.778 m)   Weight:   95.8 kg (211 lb 3.2 oz)   SpO2: 98% 98% 96% 97%    Wt Readings from Last 3 Encounters:  02/23/15 95.8 kg (211 lb 3.2 oz)  07/06/12 99.791 kg (220 lb)  09/04/11 104.327 kg (230 lb)    General:  Appears calm and comfortable Eyes: PERRL, normal lids, irises & conjunctiva ENT: grossly normal hearing, lips & tongue Neck: no LAD, masses or thyromegaly Cardiovascular: RRR, no m/r/g. No LE edema. Respiratory: CTA bilaterally, no w/r/r. Normal respiratory effort. Abdomen: soft, ntnd Skin: no rash or induration seen on limited exam Musculoskeletal: grossly normal tone BUE/BLE Psychiatric: grossly normal mood and affect, speech fluent and appropriate Neurologic: grossly non-focal.          Labs on Admission:  Basic Metabolic Panel:  Recent Labs Lab 02/23/15 1405  NA 139  K 3.5  CL 104  CO2 24  GLUCOSE 146*  BUN 18  CREATININE 1.00  CALCIUM 9.6   Liver Function Tests: No results for input(s): AST, ALT, ALKPHOS, BILITOT, PROT, ALBUMIN in the last 168 hours. No results for input(s): LIPASE, AMYLASE in the last 168 hours. No results for input(s): AMMONIA in the last 168 hours. CBC:  Recent Labs Lab 02/23/15 1405  WBC 6.4  NEUTROABS 4.3  HGB 16.2  HCT 48.0  MCV 94.7  PLT 256   Cardiac Enzymes:  Recent Labs Lab 02/23/15 1405  TROPONINI <0.03    BNP (last 3 results) No results for input(s): BNP in the last 8760 hours.  ProBNP (last 3 results) No results for input(s): PROBNP in the last 8760 hours.  CBG:  Recent Labs Lab 02/23/15 1748  GLUCAP 86    Radiological Exams on Admission: Dg Chest 2 View  02/23/2015    CLINICAL DATA:  Chest pain starting this morning  EXAM: CHEST  2 VIEW  COMPARISON:  09/04/2011  FINDINGS: Cardiomediastinal silhouette is stable. No acute infiltrate or pleural effusion. No pulmonary edema. Bony thorax is unremarkable.  IMPRESSION: No active cardiopulmonary disease.   Electronically Signed   By: Natasha MeadLiviu  Pop M.D.   On: 02/23/2015 14:25      Assessment/Plan Principal Problem:   Chest pain Active Problems:   Asthma, chronic   Diabetes   1. Chest Pain -atypical pain but with history of Diabetes Needs to be admitted for observation -will check serial enzymes -currently the patient is pain free  2. Chronic Asthma -will continue with inhalers -currently stable  3.  Diabetes Mellitus -will check A1C -will monitor FSBS and cover as needed    Code Status: Full Code (must indicate code status--if unknown or must be presumed, indicate so) DVT Prophylaxis:Heparin Family Communication: Wife (indicate person spoken with, if applicable, with phone number if by telephone) Disposition Plan: Home (indicate anticipated LOS)  Time spent: obs  [facility] Pager [phone]

## 2015-02-24 ENCOUNTER — Observation Stay (HOSPITAL_COMMUNITY): Payer: BLUE CROSS/BLUE SHIELD

## 2015-02-24 ENCOUNTER — Encounter (HOSPITAL_COMMUNITY): Payer: Self-pay | Admitting: Radiology

## 2015-02-24 DIAGNOSIS — E119 Type 2 diabetes mellitus without complications: Secondary | ICD-10-CM | POA: Insufficient documentation

## 2015-02-24 DIAGNOSIS — J45909 Unspecified asthma, uncomplicated: Secondary | ICD-10-CM

## 2015-02-24 DIAGNOSIS — I10 Essential (primary) hypertension: Secondary | ICD-10-CM

## 2015-02-24 DIAGNOSIS — R079 Chest pain, unspecified: Secondary | ICD-10-CM

## 2015-02-24 DIAGNOSIS — R0789 Other chest pain: Secondary | ICD-10-CM

## 2015-02-24 LAB — CBC
HEMATOCRIT: 46.2 % (ref 39.0–52.0)
HEMOGLOBIN: 15.7 g/dL (ref 13.0–17.0)
MCH: 31.8 pg (ref 26.0–34.0)
MCHC: 34 g/dL (ref 30.0–36.0)
MCV: 93.5 fL (ref 78.0–100.0)
Platelets: 263 10*3/uL (ref 150–400)
RBC: 4.94 MIL/uL (ref 4.22–5.81)
RDW: 12.3 % (ref 11.5–15.5)
WBC: 7.7 10*3/uL (ref 4.0–10.5)

## 2015-02-24 LAB — COMPREHENSIVE METABOLIC PANEL
ALK PHOS: 49 U/L (ref 39–117)
ALT: 21 U/L (ref 0–53)
AST: 18 U/L (ref 0–37)
Albumin: 3.9 g/dL (ref 3.5–5.2)
Anion gap: 11 (ref 5–15)
BILIRUBIN TOTAL: 1.2 mg/dL (ref 0.3–1.2)
BUN: 10 mg/dL (ref 6–23)
CALCIUM: 9.2 mg/dL (ref 8.4–10.5)
CHLORIDE: 104 mmol/L (ref 96–112)
CO2: 24 mmol/L (ref 19–32)
Creatinine, Ser: 1.04 mg/dL (ref 0.50–1.35)
GFR calc Af Amer: >90 mL/min (ref >90)
GFR calc non Af Amer: 83 mL/min — ABNORMAL LOW (ref >90)
Glucose, Bld: 179 mg/dL — ABNORMAL HIGH (ref 70–99)
Potassium: 3.9 mmol/L (ref 3.5–5.1)
Sodium: 139 mmol/L (ref 135–145)
Total Protein: 7.2 g/dL (ref 6.0–8.3)

## 2015-02-24 LAB — GLUCOSE, CAPILLARY
GLUCOSE-CAPILLARY: 134 mg/dL — AB (ref 70–99)
Glucose-Capillary: 117 mg/dL — ABNORMAL HIGH (ref 70–99)

## 2015-02-24 LAB — TROPONIN I: Troponin I: <0.03 ng/mL (ref <0.031)

## 2015-02-24 MED ORDER — IOHEXOL 350 MG/ML SOLN
80.0000 mL | Freq: Once | INTRAVENOUS | Status: AC | PRN
  Administered 2015-02-24: 80 mL via INTRAVENOUS

## 2015-02-24 MED ORDER — METOPROLOL TARTRATE 1 MG/ML IV SOLN
2.5000 mg | Freq: Once | INTRAVENOUS | Status: AC
  Administered 2015-02-24: 2.5 mg via INTRAVENOUS
  Filled 2015-02-24: qty 5

## 2015-02-24 MED ORDER — METOPROLOL TARTRATE 25 MG PO TABS
25.0000 mg | ORAL_TABLET | Freq: Once | ORAL | Status: AC
  Administered 2015-02-24: 25 mg via ORAL
  Filled 2015-02-24: qty 1

## 2015-02-24 MED ORDER — METOPROLOL TARTRATE 1 MG/ML IV SOLN
2.5000 mg | Freq: Once | INTRAVENOUS | Status: AC
  Administered 2015-02-24: 2.5 mg via INTRAVENOUS

## 2015-02-24 MED ORDER — METOPROLOL TARTRATE 1 MG/ML IV SOLN
INTRAVENOUS | Status: AC
  Filled 2015-02-24: qty 5

## 2015-02-24 MED ORDER — NITROGLYCERIN 0.4 MG SL SUBL
SUBLINGUAL_TABLET | SUBLINGUAL | Status: AC
  Administered 2015-02-24: 0.4 mg
  Filled 2015-02-24: qty 1

## 2015-02-24 NOTE — Consult Note (Signed)
CARDIOLOGY CONSULT NOTE  Patient ID: Luke Rodriguez MRN: [id number] DOB/AGE: Oct 25, 1966 48 y.o.  Admit date: 02/23/2015 Primary Cardiologist: New Reason for Consultation: Chest pain  HPI: 49 yo with history of type II diabetes was admitted yesterday for chest pain.  Patient has not had chest pain in the past.  He had a workup in 9/15 for diaphoresis and palpitations.  He had a stress echo at that time at [facility] that was normal (report on Care Everywhere).  He did not have a heart monitor done.  Palpitations have now pretty much resolved.  Yesterday, he was at home not doing anything in particular and developed tightness in his central chest, 2/10.  The tightness was persistent for about 3 hours.  He also had "waves" of burning across his chest that would come and go.  Given diabetes, he was concerned that the pain was cardiac so came to the ER.  Pain resolved with aspirin in the ER.  He has had no chest pain since.  Troponin has been negative x 3 and ECG is unremarkable.  He feels fine this morning.  At baseline, no exertional dyspnea or chest pain.  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  Able to jog 5 miles (did this earlier in the week).   Review of systems complete and found to be negative unless listed above in HPI  Past Medical History: 1. Type II diabetes 2. Palpitations 3. Stress echo (9/15) at [facility] was normal.   FH: Mother with diabetes and "heart problems"  History   Social History  . Marital Status: Married    Spouse Name: N/A  . Number of Children: N/A  . Years of Education: N/A   Occupational History  . Not on file.   Social History Main Topics  . Smoking status: Never Smoker   . Smokeless tobacco: Never Used  . Alcohol Use: No  . Drug Use: No  . Sexual Activity: Yes   Other Topics Concern  . Not on file   Social History Narrative     Prescriptions prior to admission  Medication Sig Dispense Refill Last Dose   . albuterol (PROVENTIL HFA;VENTOLIN HFA) 108 (90 BASE) MCG/ACT inhaler Inhale 1-2 puffs into the lungs 2 (two) times daily as needed for wheezing or shortness of breath.    02/22/2015 at Unknown time  . citalopram (CELEXA) 20 MG tablet Take 20 mg by mouth daily.   02/23/2015 at Unknown time  . insulin glargine (LANTUS) 100 UNIT/ML injection Inject into the skin at bedtime.     . INVOKANA 300 MG TABS tablet Take 300 mg by mouth daily.  3 02/23/2015 at Unknown time  . KOMBIGLYZE XR 2.04-999 MG TB24 Take 2 tablets by mouth daily.   3 02/23/2015 at Unknown time  . mometasone (ASMANEX) 220 MCG/INH inhaler Inhale 1 puff into the lungs daily.    seasonal  . budesonide-formoterol (SYMBICORT) 80-4.5 MCG/ACT inhaler Inhale 2 puffs into the lungs 2 (two) times daily.     09/04/2011 at Unknown  . Lancets (ONETOUCH ULTRASOFT) lancets   2   . metFORMIN (GLUCOPHAGE) 1000 MG tablet Take 1,000 mg by mouth daily.     09/04/2011 at Unknown  . metoCLOPramide (REGLAN) 10 MG tablet Take 1 tablet (10 mg total) by mouth every 6 (six) hours as needed (nausea). 30 tablet 0   . ONE TOUCH ULTRA TEST test strip   3   . Tamsulosin HCl (FLOMAX) 0.4  MG CAPS Take 1 capsule (0.4 mg total) by mouth daily. (Patient not taking: Reported on 02/23/2015) 5 capsule 0 Not Taking at Unknown time    Physical exam Blood pressure 127/66, pulse 64, temperature 98.3 F (36.8 C), temperature source Oral, resp. rate 18, height 5\' 10"  (1.778 m), weight 210 lb 12.8 oz (95.618 kg), SpO2 94 %. General: NAD Neck: No JVD, no thyromegaly or thyroid nodule.  Lungs: Clear to auscultation bilaterally with normal respiratory effort. CV: Nondisplaced PMI.  Heart regular S1/S2, no S3/S4, no murmur.  No peripheral edema.  No carotid bruit.  Normal pedal pulses.  Abdomen: Soft, nontender, no hepatosplenomegaly, no distention.  Skin: Intact without lesions or rashes.  Neurologic: Alert and oriented x 3.  Psych: Normal affect. Extremities: No clubbing or  cyanosis.  HEENT: Normal.   Labs:   Lab Results  Component Value Date   WBC 6.4 02/23/2015   HGB 16.2 02/23/2015   HCT 48.0 02/23/2015   MCV 94.7 02/23/2015   PLT 256 02/23/2015    Recent Labs Lab 02/23/15 1405  NA 139  K 3.5  CL 104  CO2 24  BUN 18  CREATININE 1.00  CALCIUM 9.6  GLUCOSE 146*   TnI negative x 3  Radiology: - CXR: Clear  EKG: NSR, no significant abnormalities  ASSESSMENT AND PLAN: 49 yo with history of type II diabetes presented with prolonged, but atypical, chest pain.  Troponin negative x 3 and ECG unremarkable.  He had a negative stress echo (for diaphoresis and palpitations apparently) in 9/15.  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  I think that it would be reasonable to do a coronary CT angiogram.  This will show us obstructive CAD but will also show nonobstructive plaque.  Would have a low threshold to start a statin if plaque present.  He does not need another echo (normal EF in 9/15).   Marca AnconaDalton Amel Kitch 02/24/2015 9:31 AM

## 2015-02-24 NOTE — Discharge Instructions (Signed)

## 2015-02-24 NOTE — Discharge Summary (Signed)
Physician Discharge Summary  Luke Rodriguez JSE:[id number] DOB: 08/12/1966 DOA: 02/23/2015  PCP: Lolita Patella, MD  Admit date: 02/23/2015 Discharge date: 02/24/2015  Recommendations for Outpatient Follow-up:  May continue low dose aspirin 81 mg daily on discharge.  Cardiac enzymes all within normal limits Cardiac CT WNL  Discharge Diagnoses:  Principal Problem:   Chest pain Active Problems:   Asthma, chronic   Diabetes    Discharge Condition: stable   Diet recommendation: as tolerated   History of present illness:  49 yo with history of type II diabetes who presented to [facility] ED with complaints of worsening chest pain with associated palpitations. Chest pain started at rest and pain was retrosternal, 2/10 in intensity, intermittent but lasted for about 3 hours prior to resolving. Non radiating. No acute ischemic changes seen on 12 lead EKG. Cardiac enzymes WNL.  Hospital Course:   Principal Problem:   Chest pain - No evidence of ischemia - Cardiac enzymes are WNL - Cardiac CT WNL - Ok for discharge per cardio      Signed:  Manson Passey, MD  [facility] 02/24/2015, 9:32 AM  Pager #: [phone]  Procedures:  Cardiac CT  Consultations:  Cardiology   Discharge Exam: Filed Vitals:   02/24/15 0600  BP: 127/66  Pulse: 64  Temp: 98.3 F (36.8 C)  Resp:    Filed Vitals:   02/23/15 1630 02/23/15 1852 02/23/15 2036 02/24/15 0600  BP: 129/65 135/72 135/63 127/66  Pulse: 56 61 66 64  Temp:  98.2 F (36.8 C) 98.4 F (36.9 C) 98.3 F (36.8 C)  TempSrc:  Oral Oral Oral  Resp: Height:   (1.778 m)    Weight:  95.8 kg (211 lb 3.2 oz)  95.618 kg (210 lb 12.8 oz)  SpO2: 98% 96% 97% 94%    General: Pt is alert, follows commands appropriately, not in acute distress Cardiovascular: Regular rate and rhythm, S1/S2 +, no murmurs Respiratory: Clear to auscultation bilaterally, no wheezing, no crackles, no rhonchi Abdominal: Soft,  non tender, non distended, bowel sounds +, no guarding Extremities: no edema, no cyanosis, pulses palpable bilaterally DP and PT Neuro: Grossly nonfocal  Discharge Instructions  Discharge Instructions    Call MD for:  difficulty breathing, headache or visual disturbances    Complete by:  As directed      Call MD for:  persistant nausea and vomiting    Complete by:  As directed      Call MD for:  redness, tenderness, or signs of infection (pain, swelling, redness, odor or green/yellow discharge around incision site)    Complete by:  As directed      Call MD for:  severe uncontrolled pain    Complete by:  As directed      Diet - low sodium heart healthy    Complete by:  As directed      Discharge instructions    Complete by:  As directed   May continue low dose aspirin 81 mg daily on discharge.  Cardiac enzymes all within normal limits     Increase activity slowly    Complete by:  As directed             Medication List    TAKE these medications        albuterol 108 (90 BASE) MCG/ACT inhaler  Commonly known as:  PROVENTIL HFA;VENTOLIN HFA  Inhale 1-2 puffs into the lungs 2 (two) times daily as needed for wheezing or  shortness of breath.     budesonide-formoterol 80-4.5 MCG/ACT inhaler  Commonly known as:  SYMBICORT  Inhale 2 puffs into the lungs 2 (two) times daily.     citalopram 20 MG tablet  Commonly known as:  CELEXA  Take 20 mg by mouth daily.     insulin glargine 100 UNIT/ML injection  Commonly known as:  LANTUS  Inject into the skin at bedtime.     INVOKANA 300 MG Tabs tablet  Generic drug:  canagliflozin  Take 300 mg by mouth daily.     KOMBIGLYZE XR 2.04-999 MG Tb24  Generic drug:  Saxagliptin-Metformin  Take 2 tablets by mouth daily.     metFORMIN 1000 MG tablet  Commonly known as:  GLUCOPHAGE  Take 1,000 mg by mouth daily.     metoCLOPramide 10 MG tablet  Commonly known as:  REGLAN  Take 1 tablet (10 mg total) by mouth every 6 (six) hours as  needed (nausea).     mometasone 220 MCG/INH inhaler  Commonly known as:  ASMANEX  Inhale 1 puff into the lungs daily.     ONE TOUCH ULTRA TEST test strip  Generic drug:  glucose blood     onetouch ultrasoft lancets     tamsulosin 0.4 MG Caps capsule  Commonly known as:  FLOMAX  Take 1 capsule (0.4 mg total) by mouth daily.           Follow-up Information    Follow up with Lolita PatellaEADE,ROBERT ALEXANDER, MD. Schedule an appointment as soon as possible for a visit in 2 weeks.   Specialty:  Family Medicine   Why:  Follow up appt after recent hospitalization   Contact information:   [address] [place] [address] [phone]        The results of significant diagnostics from this hospitalization (including imaging, microbiology, ancillary and laboratory) are listed below for reference.    Significant Diagnostic Studies: Dg Chest 2 View  02/23/2015   CLINICAL DATA:  Chest pain starting this morning  EXAM: CHEST  2 VIEW  COMPARISON:  09/04/2011  FINDINGS: Cardiomediastinal silhouette is stable. No acute infiltrate or pleural effusion. No pulmonary edema. Bony thorax is unremarkable.  IMPRESSION: No active cardiopulmonary disease.   Electronically Signed   By: Natasha MeadLiviu  Pop M.D.   On: 02/23/2015 14:25    Microbiology: No results found for this or any previous visit (from the past 240 hour(s)).   Labs: Basic Metabolic Panel:  Recent Labs Lab 02/23/15 1405  NA 139  K 3.5  CL 104  CO2 24  GLUCOSE 146*  BUN 18  CREATININE 1.00  CALCIUM 9.6   Liver Function Tests: No results for input(s): AST, ALT, ALKPHOS, BILITOT, PROT, ALBUMIN in the last 168 hours. No results for input(s): LIPASE, AMYLASE in the last 168 hours. No results for input(s): AMMONIA in the last 168 hours. CBC:  Recent Labs Lab 02/23/15 1405  WBC 6.4  NEUTROABS 4.3  HGB 16.2  HCT 48.0  MCV 94.7  PLT 256   Cardiac Enzymes:  Recent Labs Lab 02/23/15 1405 02/23/15 2156  02/24/15 0219  TROPONINI <0.03 <0.03 <0.03   BNP: BNP (last 3 results) No results for input(s): BNP in the last 8760 hours.  ProBNP (last 3 results) No results for input(s): PROBNP in the last 8760 hours.  CBG:  Recent Labs Lab 02/23/15 1748 02/23/15 2041 02/24/15 0803  GLUCAP 86 185* 117*    Time coordinating discharge: Over 30 minutes

## 2015-02-24 NOTE — Progress Notes (Signed)
UR completed 

## 2015-02-25 LAB — HEMOGLOBIN A1C
Hgb A1c MFr Bld: 7.3 % — ABNORMAL HIGH (ref 4.8–5.6)
Mean Plasma Glucose: 163 mg/dL

## 2016-01-31 ENCOUNTER — Emergency Department (HOSPITAL_BASED_OUTPATIENT_CLINIC_OR_DEPARTMENT_OTHER): Payer: BLUE CROSS/BLUE SHIELD

## 2016-01-31 ENCOUNTER — Emergency Department (HOSPITAL_BASED_OUTPATIENT_CLINIC_OR_DEPARTMENT_OTHER)
Admission: EM | Admit: 2016-01-31 | Discharge: 2016-01-31 | Disposition: A | Discharge status: Home / Self Care | Payer: BLUE CROSS/BLUE SHIELD | Attending: Emergency Medicine | Admitting: Emergency Medicine

## 2016-01-31 ENCOUNTER — Encounter (HOSPITAL_BASED_OUTPATIENT_CLINIC_OR_DEPARTMENT_OTHER): Payer: Self-pay | Admitting: *Deleted

## 2016-01-31 DIAGNOSIS — E860 Dehydration: Secondary | ICD-10-CM | POA: Diagnosis not present

## 2016-01-31 DIAGNOSIS — M545 Low back pain: Secondary | ICD-10-CM | POA: Diagnosis not present

## 2016-01-31 DIAGNOSIS — R42 Dizziness and giddiness: Secondary | ICD-10-CM

## 2016-01-31 DIAGNOSIS — R079 Chest pain, unspecified: Secondary | ICD-10-CM | POA: Diagnosis not present

## 2016-01-31 DIAGNOSIS — Z7984 Long term (current) use of oral hypoglycemic drugs: Secondary | ICD-10-CM | POA: Insufficient documentation

## 2016-01-31 DIAGNOSIS — E119 Type 2 diabetes mellitus without complications: Secondary | ICD-10-CM | POA: Insufficient documentation

## 2016-01-31 DIAGNOSIS — J45909 Unspecified asthma, uncomplicated: Secondary | ICD-10-CM | POA: Insufficient documentation

## 2016-01-31 DIAGNOSIS — Z79899 Other long term (current) drug therapy: Secondary | ICD-10-CM | POA: Diagnosis not present

## 2016-01-31 DIAGNOSIS — F419 Anxiety disorder, unspecified: Secondary | ICD-10-CM | POA: Diagnosis not present

## 2016-01-31 DIAGNOSIS — Z87442 Personal history of urinary calculi: Secondary | ICD-10-CM | POA: Diagnosis not present

## 2016-01-31 LAB — URINE MICROSCOPIC-ADD ON

## 2016-01-31 LAB — URINALYSIS, ROUTINE W REFLEX MICROSCOPIC
Bilirubin Urine: NEGATIVE
HGB URINE DIPSTICK: NEGATIVE
KETONES UR: NEGATIVE mg/dL
Leukocytes, UA: NEGATIVE
Nitrite: NEGATIVE
Protein, ur: NEGATIVE mg/dL
Specific Gravity, Urine: 1.021 (ref 1.005–1.030)
pH: 6 (ref 5.0–8.0)

## 2016-01-31 LAB — CBC
HCT: 47.5 % (ref 39.0–52.0)
Hemoglobin: 15.9 g/dL (ref 13.0–17.0)
MCH: 31.2 pg (ref 26.0–34.0)
MCHC: 33.5 g/dL (ref 30.0–36.0)
MCV: 93.3 fL (ref 78.0–100.0)
PLATELETS: 274 10*3/uL (ref 150–400)
RBC: 5.09 MIL/uL (ref 4.22–5.81)
RDW: 12.4 % (ref 11.5–15.5)
WBC: 9.9 10*3/uL (ref 4.0–10.5)

## 2016-01-31 LAB — BASIC METABOLIC PANEL
ANION GAP: 12 (ref 5–15)
BUN: 14 mg/dL (ref 6–20)
CALCIUM: 9.4 mg/dL (ref 8.9–10.3)
CO2: 25 mmol/L (ref 22–32)
Chloride: 102 mmol/L (ref 101–111)
Creatinine, Ser: 0.89 mg/dL (ref 0.61–1.24)
GFR calc non Af Amer: >60 mL/min (ref >60)
GLUCOSE: 132 mg/dL — AB (ref 65–99)
Potassium: 3.7 mmol/L (ref 3.5–5.1)
Sodium: 139 mmol/L (ref 135–145)

## 2016-01-31 LAB — TROPONIN I: Troponin I: <0.03 ng/mL (ref <0.031)

## 2016-01-31 MED ORDER — ASPIRIN 81 MG PO CHEW
324.0000 mg | CHEWABLE_TABLET | Freq: Once | ORAL | Status: AC
  Administered 2016-01-31: 324 mg via ORAL
  Filled 2016-01-31: qty 4

## 2016-01-31 MED ORDER — SODIUM CHLORIDE 0.9 % IV BOLUS (SEPSIS)
1000.0000 mL | Freq: Once | INTRAVENOUS | Status: AC
  Administered 2016-01-31: 1000 mL via INTRAVENOUS

## 2016-01-31 NOTE — ED Notes (Signed)
Pt taken directly from registration by this nurse first for triage and ekg, pt reports cp x 10am, which resolved, recurred at 11am. 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 ekg in progress at triage.

## 2016-01-31 NOTE — ED Provider Notes (Signed)
CSN: [id number]     Arrival date & time 01/31/16  1705 History   First MD Initiated Contact with Patient 01/31/16 1713     Chief Complaint  Patient presents with  . Chest Pain     (Consider location/radiation/quality/duration/timing/severity/associated sxs/prior Treatment) HPI Patient states he has not felt like himself for the past 3 weeks. States he just feels "off". Today he began having central chest pain radiating to the back. He had multiple episodes throughout the day. Described as tightness. Not associated with deep breaths. No nausea, vomiting or abdominal pain. States he's had similar pain and was admitted in April where he had a cardiac evaluation that was normal. He denies any recent lower extremity swelling or pain. Past Medical History  Diagnosis Date  . Asthma   . Type II diabetes mellitus (HCC)   . Kidney stones   . Anxiety    Past Surgical History  Procedure Laterality Date  . Vasectomy    . Extracorporeal shock wave lithotripsy  2013  . Cystoscopy with stent placement  2013    "2 stents"   History reviewed. No pertinent family history. Social History  Substance Use Topics  . Smoking status: Never Smoker   . Smokeless tobacco: Never Used  . Alcohol Use: No    Review of Systems  Constitutional: Positive for fatigue. Negative for fever and chills.  HENT: Negative for congestion, ear pain, hearing loss, rhinorrhea, sinus pressure and sore throat.   Eyes: Negative for visual disturbance.  Respiratory: Negative for cough and shortness of breath.   Cardiovascular: Positive for chest pain. Negative for palpitations and leg swelling.  Gastrointestinal: Negative for nausea, vomiting, abdominal pain, diarrhea and constipation.  Genitourinary: Negative for dysuria, frequency and flank pain.  Musculoskeletal: Positive for back pain. Negative for myalgias, neck pain and neck stiffness.  Skin: Negative for rash and wound.  Neurological: Positive for dizziness and  light-headedness. Negative for weakness, numbness and headaches.  All other systems reviewed and are negative.     Allergies  Budesonide-formoterol fumarate  Home Medications   Prior to Admission medications   Medication Sig Start Date End Date Taking? Authorizing Provider  dapagliflozin propanediol (FARXIGA) 5 MG TABS tablet Take 5 mg by mouth daily.   Yes Historical Provider, MD  albuterol (PROVENTIL HFA;VENTOLIN HFA) 108 (90 BASE) MCG/ACT inhaler Inhale 1-2 puffs into the lungs 2 (two) times daily as needed for wheezing or shortness of breath.     Historical Provider, MD  citalopram (CELEXA) 20 MG tablet Take 20 mg by mouth daily.    Historical Provider, MD  KOMBIGLYZE XR 2.04-999 MG TB24 Take 2 tablets by mouth daily.  02/22/15   Historical Provider, MD  Lancets Letta Pate ULTRASOFT) lancets  02/22/15   Historical Provider, MD  metoCLOPramide (REGLAN) 10 MG tablet Take 1 tablet (10 mg total) by mouth every 6 (six) hours as needed (nausea). 07/07/12 07/17/12  Dione Booze, MD  ONE TOUCH ULTRA TEST test strip  02/22/15   Historical Provider, MD  Tamsulosin HCl (FLOMAX) 0.4 MG CAPS Take 1 capsule (0.4 mg total) by mouth daily. Patient not taking: Reported on 02/23/2015 07/07/12   Dione Booze, MD   BP 122/65 mmHg  Pulse 63  Temp(Src) 98.5 F (36.9 C) (Oral)  Resp 22  Ht  (1.778 m)  Wt 218 lb (98.884 kg)  BMI 31.28 kg/m2  SpO2 96% Physical Exam  Constitutional: He is oriented to person, place, and time. He appears well-developed and well-nourished.  Anxious appearing  HENT:  Head: Normocephalic and atraumatic.  Mouth/Throat: Oropharynx is clear and moist. No oropharyngeal exudate.  No sinus tenderness with percussion  Eyes: EOM are normal. Pupils are equal, round, and reactive to light.  Neck: Normal range of motion. Neck supple.  Cardiovascular: Normal rate and regular rhythm.  Exam reveals no gallop and no friction rub.   No murmur heard. Pulmonary/Chest: Effort normal and breath  sounds normal. No respiratory distress. He has no wheezes. He has no rales. He exhibits no tenderness.  Abdominal: Soft. Bowel sounds are normal. He exhibits no distension and no mass. There is no tenderness. There is no rebound and no guarding.  Musculoskeletal: Normal range of motion. He exhibits no edema or tenderness.  No lower extremity swelling or pain. No midline thoracic or lumbar tenderness. No bilateral CVA tenderness. Distal pulses are equal and intact.  Neurological: He is alert and oriented to person, place, and time.  Patient is alert and oriented x3 with clear, goal oriented speech. Patient has 5/5 motor in all extremities. Sensation is intact to light touch. Bilateral finger-to-nose is normal with no signs of dysmetria.   Skin: Skin is warm and dry. No rash noted. No erythema.  Psychiatric: He has a normal mood and affect. His behavior is normal.  Nursing note and vitals reviewed.   ED Course  Procedures (including critical care time) Labs Review Labs Reviewed  BASIC METABOLIC PANEL - Abnormal; Notable for the following:    Glucose, Bld 132 (*)    All other components within normal limits  URINALYSIS, ROUTINE W REFLEX MICROSCOPIC (NOT AT [facility]) - Abnormal; Notable for the following:    Glucose, UA >1000 (*)    All other components within normal limits  URINE MICROSCOPIC-ADD ON - Abnormal; Notable for the following:    Squamous Epithelial / LPF 0-5 (*)    Bacteria, UA RARE (*)    All other components within normal limits  CBC  TROPONIN I  TROPONIN I    Imaging Review Dg Chest 2 View  01/31/2016  CLINICAL DATA:  Mid chest pain beginning today. EXAM: CHEST  2 VIEW COMPARISON:  02/23/2015 FINDINGS: The heart size and mediastinal contours are within normal limits. Both lungs are clear. No pleural effusion or pneumothorax. The visualized skeletal structures are unremarkable. IMPRESSION: No active cardiopulmonary disease. Electronically Signed   By: Amie Portland M.D.   On:  01/31/2016 17:47   I have personally reviewed and evaluated these images and lab results as part of my medical decision-making.   EKG Interpretation   Date/Time:  Wednesday January 31 2016 17:13:28 EST Ventricular Rate:  75 PR Interval:  200 QRS Duration: 92 QT Interval:  384 QTC Calculation: 428 R Axis:   92 Text Interpretation:  Normal sinus rhythm Rightward axis Borderline ECG  Confirmed by Maycen Degregory  MD, [place] ([id number]) on 01/31/2016 6:57:28 PM      MDM   Final diagnoses:  Chest pain, unspecified chest pain type  Dizziness  Dehydration    He states she is feeling much better with IV fluids. Chest pain is completely resolved. Initial EKG with no evidence of ischemia. Normal initial troponin. Patient did have elevation in heart rate significantly when changing positions. Suspect some degree of dehydration.  He remained symptom-free. Repeat troponin is normal. Patient and related without any difficulty. Encouraged to drink plenty of fluids.  Loren Racer, MD 01/31/16 2155

## 2016-01-31 NOTE — ED Notes (Signed)
Pt verbalizes understanding of d/c instructions and denies any further needs at this time. 

## 2016-01-31 NOTE — Discharge Instructions (Signed)
Dehydration, Adult °Dehydration is a condition in which you do not have enough fluid or water in your body. It happens when you take in less fluid than you lose. Vital organs such as the kidneys, brain, and heart cannot function without a proper amount of fluids. Any loss of fluids from the body can cause dehydration.  °Dehydration can range from mild to severe. This condition should be treated right away to help prevent it from becoming severe. °CAUSES  °This condition may be caused by: °· Vomiting. °· Diarrhea. °· Excessive sweating, such as when exercising in hot or humid weather. °· Not drinking enough fluid during strenuous exercise or during an illness. °· Excessive urine output. °· Fever. °· Certain medicines. °RISK FACTORS °This condition is more likely to develop in: °· People who are taking certain medicines that cause the body to lose excess fluid (diuretics).   °· People who have a chronic illness, such as diabetes, that may increase urination. °· Older adults.   °· People who live at high altitudes.   °· People who participate in endurance sports.   °SYMPTOMS  °Mild Dehydration °· Thirst. °· Dry lips. °· Slightly dry mouth. °· Dry, warm skin. °Moderate Dehydration °· Very dry mouth.   °· Muscle cramps.   °· Dark urine and decreased urine production.   °· Decreased tear production.   °· Headache.   °· Light-headedness, especially when you stand up from a sitting position.   °Severe Dehydration °· Changes in skin.   °¨ Cold and clammy skin.   °¨ Skin does not spring back quickly when lightly pinched and released.   °· Changes in body fluids.   °¨ Extreme thirst.   °¨ No tears.   °¨ Not able to sweat when body temperature is high, such as in hot weather.   °¨ Minimal urine production.   °· Changes in vital signs.   °¨ Rapid, weak pulse (more than 100 beats per minute when you are sitting still).   °¨ Rapid breathing.   °¨ Low blood pressure.   °· Other changes.   °¨ Sunken eyes.   °¨ Cold hands and feet.    °¨ Confusion. °¨ Lethargy and difficulty being awakened. °¨ Fainting (syncope).   °¨ Short-term weight loss.   °¨ Unconsciousness. °DIAGNOSIS  °This condition may be diagnosed based on your symptoms. You may also have tests to determine how severe your dehydration is. These tests may include:  °· Urine tests.   °· Blood tests.   °TREATMENT  °Treatment for this condition depends on the severity. Mild or moderate dehydration can often be treated at home. Treatment should be started right away. Do not wait until dehydration becomes severe. Severe dehydration needs to be treated at the hospital. °Treatment for Mild Dehydration °· Drinking plenty of water to replace the fluid you have lost.   °· Replacing minerals in your blood (electrolytes) that you may have lost.   °Treatment for Moderate Dehydration  °· Consuming oral rehydration solution (ORS). °Treatment for Severe Dehydration °· Receiving fluid through an IV tube.   °· Receiving electrolyte solution through a feeding tube that is passed through your nose and into your stomach (nasogastric tube or NG tube). °· Correcting any abnormalities in electrolytes. °HOME CARE INSTRUCTIONS  °· Drink enough fluid to keep your urine clear or pale yellow.   °· Drink water or fluid slowly by taking small sips. You can also try sucking on ice cubes.  °· Have food or beverages that contain electrolytes. Examples include bananas and sports drinks. °· Take over-the-counter and prescription medicines only as told by your health care provider.   °· Prepare ORS according to the manufacturer's instructions. Take sips   of ORS every 5 minutes until your urine returns to normal.  If you have vomiting or diarrhea, continue to try to drink water, ORS, or both.   If you have diarrhea, avoid:   Beverages that contain caffeine.   Fruit juice.   Milk.   Carbonated soft drinks.  Do not take salt tablets. This can lead to the condition of having too much sodium in your body  (hypernatremia).  SEEK MEDICAL CARE IF:  You cannot eat or drink without vomiting.  You have had moderate diarrhea during a period of more than 24 hours.  You have a fever. SEEK IMMEDIATE MEDICAL CARE IF:   You have extreme thirst.  You have severe diarrhea.  You have not urinated in 6-8 hours, or you have urinated only a small amount of very dark urine.  You have shriveled skin.  You are dizzy, confused, or both.   This information is not intended to replace advice given to you by your health care provider. Make sure you discuss any questions you have with your health care provider.   Document Released: 12/02/2005 Document Revised: 08/23/2015 Document Reviewed: 04/19/2015 Elsevier Interactive Patient Education 2016 [employer]  Nonspecific Chest Pain  Chest pain can be caused by many different conditions. There is always a chance that your pain could be related to something serious, such as a heart attack or a blood clot in your lungs. Chest pain can also be caused by conditions that are not life-threatening. If you have chest pain, it is very important to follow up with your health care provider. CAUSES  Chest pain can be caused by:  Heartburn.  Pneumonia or bronchitis.  Anxiety or stress.  Inflammation around your heart (pericarditis) or lung (pleuritis or pleurisy).  A blood clot in your lung.  A collapsed lung (pneumothorax). It can develop suddenly on its own (spontaneous pneumothorax) or from trauma to the chest.  Shingles infection (varicella-zoster virus).  Heart attack.  Damage to the bones, muscles, and cartilage that make up your chest wall. This can include:  Bruised bones due to injury.  Strained muscles or cartilage due to frequent or repeated coughing or overwork.  Fracture to one or more ribs.  Sore cartilage due to inflammation (costochondritis). RISK FACTORS  Risk factors for chest pain may include:  Activities that increase your risk  for trauma or injury to your chest.  Respiratory infections or conditions that cause frequent coughing.  Medical conditions or overeating that can cause heartburn.  Heart disease or family history of heart disease.  Conditions or health behaviors that increase your risk of developing a blood clot.  Having had chicken pox (varicella zoster). SIGNS AND SYMPTOMS Chest pain can feel like:  Burning or tingling on the surface of your chest or deep in your chest.  Crushing, pressure, aching, or squeezing pain.  Dull or sharp pain that is worse when you move, cough, or take a deep breath.  Pain that is also felt in your back, neck, shoulder, or arm, or pain that spreads to any of these areas. Your chest pain may come and go, or it may stay constant. DIAGNOSIS Lab tests or other studies may be needed to find the cause of your pain. Your health care provider may have you take a test called an ambulatory ECG (electrocardiogram). An ECG records your heartbeat patterns at the time the test is performed. You may also have other tests, such as:  Transthoracic echocardiogram (TTE). During echocardiography, sound waves are  used to create a picture of all of the heart structures and to look at how blood flows through your heart.  Transesophageal echocardiogram (TEE).This is a more advanced imaging test that obtains images from inside your body. It allows your health care provider to see your heart in finer detail.  Cardiac monitoring. This allows your health care provider to monitor your heart rate and rhythm in real time.  Holter monitor. This is a portable device that records your heartbeat and can help to diagnose abnormal heartbeats. It allows your health care provider to track your heart activity for several days, if needed.  Stress tests. These can be done through exercise or by taking medicine that makes your heart beat more quickly.  Blood tests.  Imaging tests. TREATMENT  Your  treatment depends on what is causing your chest pain. Treatment may include:  Medicines. These may include:  Acid blockers for heartburn.  Anti-inflammatory medicine.  Pain medicine for inflammatory conditions.  Antibiotic medicine, if an infection is present.  Medicines to dissolve blood clots.  Medicines to treat coronary artery disease.  Supportive care for conditions that do not require medicines. This may include:  Resting.  Applying heat or cold packs to injured areas.  Limiting activities until pain decreases. HOME CARE INSTRUCTIONS  If you were prescribed an antibiotic medicine, finish it all even if you start to feel better.  Avoid any activities that bring on chest pain.  Do not use any tobacco products, including cigarettes, chewing tobacco, or electronic cigarettes. If you need help quitting, ask your health care provider.  Do not drink alcohol.  Take medicines only as directed by your health care provider.  Keep all follow-up visits as directed by your health care provider. This is important. This includes any further testing if your chest pain does not go away.  If heartburn is the cause for your chest pain, you may be told to keep your head raised (elevated) while sleeping. This reduces the chance that acid will go from your stomach into your esophagus.  Make lifestyle changes as directed by your health care provider. These may include:  Getting regular exercise. Ask your health care provider to suggest some activities that are safe for you.  Eating a heart-healthy diet. A registered dietitian can help you to learn healthy eating options.  Maintaining a healthy weight.  Managing diabetes, if necessary.  Reducing stress. SEEK MEDICAL CARE IF:  Your chest pain does not go away after treatment.  You have a rash with blisters on your chest.  You have a fever. SEEK IMMEDIATE MEDICAL CARE IF:   Your chest pain is worse.  You have an increasing  cough, or you cough up blood.  You have severe abdominal pain.  You have severe weakness.  You faint.  You have chills.  You have sudden, unexplained chest discomfort.  You have sudden, unexplained discomfort in your arms, back, neck, or jaw.  You have shortness of breath at any time.  You suddenly start to sweat, or your skin gets clammy.  You feel nauseous or you vomit.  You suddenly feel light-headed or dizzy.  Your heart begins to beat quickly, or it feels like it is skipping beats. These symptoms may represent a serious problem that is an emergency. Do not wait to see if the symptoms will go away. Get medical help right away. Call your local emergency services (911 in the [place] Do not drive yourself to the hospital.   This information is  not intended to replace advice given to you by your health care provider. Make sure you discuss any questions you have with your health care provider.   Document Released: 09/11/2005 Document Revised: 12/23/2014 Document Reviewed: 07/08/2014 Elsevier Interactive Patient Education 2016 [employer]  Nonspecific Chest Pain  Chest pain can be caused by many different conditions. There is always a chance that your pain could be related to something serious, such as a heart attack or a blood clot in your lungs. Chest pain can also be caused by conditions that are not life-threatening. If you have chest pain, it is very important to follow up with your health care provider. CAUSES  Chest pain can be caused by:  Heartburn.  Pneumonia or bronchitis.  Anxiety or stress.  Inflammation around your heart (pericarditis) or lung (pleuritis or pleurisy).  A blood clot in your lung.  A collapsed lung (pneumothorax). It can develop suddenly on its own (spontaneous pneumothorax) or from trauma to the chest.  Shingles infection (varicella-zoster virus).  Heart attack.  Damage to the bones, muscles, and cartilage that make up your chest wall.  This can include:  Bruised bones due to injury.  Strained muscles or cartilage due to frequent or repeated coughing or overwork.  Fracture to one or more ribs.  Sore cartilage due to inflammation (costochondritis). RISK FACTORS  Risk factors for chest pain may include:  Activities that increase your risk for trauma or injury to your chest.  Respiratory infections or conditions that cause frequent coughing.  Medical conditions or overeating that can cause heartburn.  Heart disease or family history of heart disease.  Conditions or health behaviors that increase your risk of developing a blood clot.  Having had chicken pox (varicella zoster). SIGNS AND SYMPTOMS Chest pain can feel like:  Burning or tingling on the surface of your chest or deep in your chest.  Crushing, pressure, aching, or squeezing pain.  Dull or sharp pain that is worse when you move, cough, or take a deep breath.  Pain that is also felt in your back, neck, shoulder, or arm, or pain that spreads to any of these areas. Your chest pain may come and go, or it may stay constant. DIAGNOSIS Lab tests or other studies may be needed to find the cause of your pain. Your health care provider may have you take a test called an ambulatory ECG (electrocardiogram). An ECG records your heartbeat patterns at the time the test is performed. You may also have other tests, such as:  Transthoracic echocardiogram (TTE). During echocardiography, sound waves are used to create a picture of all of the heart structures and to look at how blood flows through your heart.  Transesophageal echocardiogram (TEE).This is a more advanced imaging test that obtains images from inside your body. It allows your health care provider to see your heart in finer detail.  Cardiac monitoring. This allows your health care provider to monitor your heart rate and rhythm in real time.  Holter monitor. This is a portable device that records your  heartbeat and can help to diagnose abnormal heartbeats. It allows your health care provider to track your heart activity for several days, if needed.  Stress tests. These can be done through exercise or by taking medicine that makes your heart beat more quickly.  Blood tests.  Imaging tests. TREATMENT  Your treatment depends on what is causing your chest pain. Treatment may include:  Medicines. These may include:  Acid blockers for heartburn.  Anti-inflammatory  medicine.  Pain medicine for inflammatory conditions.  Antibiotic medicine, if an infection is present.  Medicines to dissolve blood clots.  Medicines to treat coronary artery disease.  Supportive care for conditions that do not require medicines. This may include:  Resting.  Applying heat or cold packs to injured areas.  Limiting activities until pain decreases. HOME CARE INSTRUCTIONS  If you were prescribed an antibiotic medicine, finish it all even if you start to feel better.  Avoid any activities that bring on chest pain.  Do not use any tobacco products, including cigarettes, chewing tobacco, or electronic cigarettes. If you need help quitting, ask your health care provider.  Do not drink alcohol.  Take medicines only as directed by your health care provider.  Keep all follow-up visits as directed by your health care provider. This is important. This includes any further testing if your chest pain does not go away.  If heartburn is the cause for your chest pain, you may be told to keep your head raised (elevated) while sleeping. This reduces the chance that acid will go from your stomach into your esophagus.  Make lifestyle changes as directed by your health care provider. These may include:  Getting regular exercise. Ask your health care provider to suggest some activities that are safe for you.  Eating a heart-healthy diet. A registered dietitian can help you to learn healthy eating  options.  Maintaining a healthy weight.  Managing diabetes, if necessary.  Reducing stress. SEEK MEDICAL CARE IF:  Your chest pain does not go away after treatment.  You have a rash with blisters on your chest.  You have a fever. SEEK IMMEDIATE MEDICAL CARE IF:   Your chest pain is worse.  You have an increasing cough, or you cough up blood.  You have severe abdominal pain.  You have severe weakness.  You faint.  You have chills.  You have sudden, unexplained chest discomfort.  You have sudden, unexplained discomfort in your arms, back, neck, or jaw.  You have shortness of breath at any time.  You suddenly start to sweat, or your skin gets clammy.  You feel nauseous or you vomit.  You suddenly feel light-headed or dizzy.  Your heart begins to beat quickly, or it feels like it is skipping beats. These symptoms may represent a serious problem that is an emergency. Do not wait to see if the symptoms will go away. Get medical help right away. Call your local emergency services (911 in the [place] Do not drive yourself to the hospital.   This information is not intended to replace advice given to you by your health care provider. Make sure you discuss any questions you have with your health care provider.   Document Released: 09/11/2005 Document Revised: 12/23/2014 Document Reviewed: 07/08/2014 Elsevier Interactive Patient Education [employer]

## 2016-05-30 DIAGNOSIS — E782 Mixed hyperlipidemia: Secondary | ICD-10-CM | POA: Diagnosis not present

## 2016-05-30 DIAGNOSIS — E1165 Type 2 diabetes mellitus with hyperglycemia: Secondary | ICD-10-CM | POA: Diagnosis not present

## 2016-06-27 DIAGNOSIS — E1165 Type 2 diabetes mellitus with hyperglycemia: Secondary | ICD-10-CM | POA: Diagnosis not present

## 2016-06-27 DIAGNOSIS — Z23 Encounter for immunization: Secondary | ICD-10-CM | POA: Diagnosis not present

## 2016-09-03 DIAGNOSIS — J209 Acute bronchitis, unspecified: Secondary | ICD-10-CM | POA: Diagnosis not present

## 2016-09-03 DIAGNOSIS — J45901 Unspecified asthma with (acute) exacerbation: Secondary | ICD-10-CM | POA: Diagnosis not present

## 2016-09-20 DIAGNOSIS — Z23 Encounter for immunization: Secondary | ICD-10-CM | POA: Diagnosis not present

## 2016-11-22 DIAGNOSIS — E669 Obesity, unspecified: Secondary | ICD-10-CM | POA: Diagnosis not present

## 2016-11-22 DIAGNOSIS — E119 Type 2 diabetes mellitus without complications: Secondary | ICD-10-CM | POA: Diagnosis not present

## 2016-11-22 DIAGNOSIS — Z0000 Encounter for general adult medical examination without abnormal findings: Secondary | ICD-10-CM | POA: Diagnosis not present

## 2016-11-22 DIAGNOSIS — I10 Essential (primary) hypertension: Secondary | ICD-10-CM | POA: Diagnosis not present

## 2016-11-22 DIAGNOSIS — E782 Mixed hyperlipidemia: Secondary | ICD-10-CM | POA: Diagnosis not present

## 2017-01-06 DIAGNOSIS — I10 Essential (primary) hypertension: Secondary | ICD-10-CM | POA: Diagnosis not present

## 2017-03-18 DIAGNOSIS — K621 Rectal polyp: Secondary | ICD-10-CM | POA: Diagnosis not present

## 2017-03-18 DIAGNOSIS — Z1211 Encounter for screening for malignant neoplasm of colon: Secondary | ICD-10-CM | POA: Diagnosis not present

## 2017-04-08 DIAGNOSIS — Z1210 Encounter for screening for malignant neoplasm of intestinal tract, unspecified: Secondary | ICD-10-CM | POA: Diagnosis not present

## 2017-04-08 DIAGNOSIS — K644 Residual hemorrhoidal skin tags: Secondary | ICD-10-CM | POA: Diagnosis not present

## 2017-04-21 DIAGNOSIS — Z1211 Encounter for screening for malignant neoplasm of colon: Secondary | ICD-10-CM | POA: Diagnosis not present

## 2017-04-21 DIAGNOSIS — K5730 Diverticulosis of large intestine without perforation or abscess without bleeding: Secondary | ICD-10-CM | POA: Diagnosis not present

## 2017-08-27 DIAGNOSIS — J4520 Mild intermittent asthma, uncomplicated: Secondary | ICD-10-CM | POA: Diagnosis not present

## 2017-08-27 DIAGNOSIS — E782 Mixed hyperlipidemia: Secondary | ICD-10-CM | POA: Diagnosis not present

## 2017-08-27 DIAGNOSIS — Z0000 Encounter for general adult medical examination without abnormal findings: Secondary | ICD-10-CM | POA: Diagnosis not present

## 2017-08-27 DIAGNOSIS — Z125 Encounter for screening for malignant neoplasm of prostate: Secondary | ICD-10-CM | POA: Diagnosis not present

## 2017-08-27 DIAGNOSIS — E1165 Type 2 diabetes mellitus with hyperglycemia: Secondary | ICD-10-CM | POA: Diagnosis not present

## 2017-08-27 DIAGNOSIS — Z23 Encounter for immunization: Secondary | ICD-10-CM | POA: Diagnosis not present

## 2017-08-27 DIAGNOSIS — I10 Essential (primary) hypertension: Secondary | ICD-10-CM | POA: Diagnosis not present

## 2017-11-16 ENCOUNTER — Emergency Department (HOSPITAL_BASED_OUTPATIENT_CLINIC_OR_DEPARTMENT_OTHER)
Admission: EM | Admit: 2017-11-16 | Discharge: 2017-11-17 | Disposition: A | Discharge status: Home / Self Care | Payer: BLUE CROSS/BLUE SHIELD | Attending: Emergency Medicine | Admitting: Emergency Medicine

## 2017-11-16 ENCOUNTER — Other Ambulatory Visit: Payer: Self-pay

## 2017-11-16 ENCOUNTER — Emergency Department (HOSPITAL_BASED_OUTPATIENT_CLINIC_OR_DEPARTMENT_OTHER): Payer: BLUE CROSS/BLUE SHIELD

## 2017-11-16 ENCOUNTER — Encounter (HOSPITAL_BASED_OUTPATIENT_CLINIC_OR_DEPARTMENT_OTHER): Payer: Self-pay | Admitting: Emergency Medicine

## 2017-11-16 DIAGNOSIS — Z794 Long term (current) use of insulin: Secondary | ICD-10-CM | POA: Insufficient documentation

## 2017-11-16 DIAGNOSIS — J45909 Unspecified asthma, uncomplicated: Secondary | ICD-10-CM | POA: Insufficient documentation

## 2017-11-16 DIAGNOSIS — Z79899 Other long term (current) drug therapy: Secondary | ICD-10-CM | POA: Diagnosis not present

## 2017-11-16 DIAGNOSIS — I493 Ventricular premature depolarization: Secondary | ICD-10-CM | POA: Insufficient documentation

## 2017-11-16 DIAGNOSIS — E119 Type 2 diabetes mellitus without complications: Secondary | ICD-10-CM | POA: Diagnosis not present

## 2017-11-16 DIAGNOSIS — R079 Chest pain, unspecified: Secondary | ICD-10-CM | POA: Diagnosis not present

## 2017-11-16 DIAGNOSIS — R002 Palpitations: Secondary | ICD-10-CM | POA: Diagnosis present

## 2017-11-16 LAB — TROPONIN I

## 2017-11-16 LAB — CBC
HEMATOCRIT: 44.3 % (ref 39.0–52.0)
Hemoglobin: 15 g/dL (ref 13.0–17.0)
MCH: 31.8 pg (ref 26.0–34.0)
MCHC: 33.9 g/dL (ref 30.0–36.0)
MCV: 94.1 fL (ref 78.0–100.0)
Platelets: 307 10*3/uL (ref 150–400)
RBC: 4.71 MIL/uL (ref 4.22–5.81)
RDW: 12.3 % (ref 11.5–15.5)
WBC: 9 10*3/uL (ref 4.0–10.5)

## 2017-11-16 LAB — BASIC METABOLIC PANEL
Anion gap: 9 (ref 5–15)
BUN: 17 mg/dL (ref 6–20)
CALCIUM: 9.1 mg/dL (ref 8.9–10.3)
CO2: 23 mmol/L (ref 22–32)
Chloride: 104 mmol/L (ref 101–111)
Creatinine, Ser: 1.03 mg/dL (ref 0.61–1.24)
GFR calc Af Amer: >60 mL/min (ref >60)
Glucose, Bld: 260 mg/dL — ABNORMAL HIGH (ref 65–99)
Potassium: 3.7 mmol/L (ref 3.5–5.1)
Sodium: 136 mmol/L (ref 135–145)

## 2017-11-16 NOTE — ED Provider Notes (Signed)
[facility] Provider Note: [name] [name], MD, FACEP  CSN: [id number] MRN: [id number] ARRIVAL: 11/16/17 at 2222 ROOM: [facility]05   CHIEF COMPLAINT  Palpitations   HISTORY OF PRESENT ILLNESS  11/16/17 11:49 PM Luke Rodriguez is a 51 y.o. male with a history of asthma.  Yesterday evening while carrying a dolly up stairs he became acutely short of breath with a rapid feeling heartbeat.  He was flushed in the face and and felt lightheaded.  This lasted about 2 minutes.  He drank some extra fluids thinking might be dehydrated with improvement.  Later while at dinner he had another episode of a rapid feeling heartbeat with facial flushing but this time without shortness of breath or lightheadedness but with some transient nausea.  This again resolved after about 2 minutes.  He had 2 additional episodes today.  The first lasted about 2 minutes.  The second lasted about 10 minutes and occurred about 30 minutes prior to arrival here.  He denies chest pain with these episodes but states his chest did feel tight when he had the shortness of breath.  He has not had shortness of breath except with the first episode.  When he appeared flushed he felt hot to the touch but took his temperature and was afebrile.  He is asymptomatic at the present time.  A review of his rhythm strip for the time he has been in the department shows no tachycardia or ectopy.   Past Medical History:  Diagnosis Date  . Anxiety   . Asthma   . Kidney stones   . Type II diabetes mellitus (HCC)     Past Surgical History:  Procedure Laterality Date  . CYSTOSCOPY WITH STENT PLACEMENT  2013   "2 stents"  . EXTRACORPOREAL SHOCK WAVE LITHOTRIPSY  2013  . VASECTOMY      History reviewed. No pertinent family history.  Social History   Tobacco Use  . Smoking status: Never Smoker  . Smokeless tobacco: Never Used  Substance Use Topics  . Alcohol use: No  . Drug use: No    Prior to Admission medications     Medication Sig Start Date End Date Taking? Authorizing Provider  Dulaglutide (TRULICITY) 1.5 MG/0.5ML SOPN Inject into the skin.   Yes [provider]  lisinopril (PRINIVIL,ZESTRIL) 10 MG tablet Take 10 mg by mouth daily.   Yes [provider]  metFORMIN (GLUCOPHAGE) 1000 MG tablet Take 1,000 mg by mouth 2 (two) times daily with a meal.   Yes [provider]  albuterol (PROVENTIL HFA;VENTOLIN HFA) 108 (90 BASE) MCG/ACT inhaler Inhale 1-2 puffs into the lungs 2 (two) times daily as needed for wheezing or shortness of breath.     [provider]  citalopram (CELEXA) 20 MG tablet Take 20 mg by mouth daily.    [provider]  KOMBIGLYZE XR 2.04-999 MG TB24 Take 2 tablets by mouth daily.  02/22/15   [provider]  Lancets Letta Pate(ONETOUCH ULTRASOFT) lancets  02/22/15   [provider]  ONE TOUCH ULTRA TEST test strip  02/22/15   [provider]    Allergies Budesonide-formoterol fumarate   REVIEW OF SYSTEMS  Negative except as noted here or in the History of Present Illness.   PHYSICAL EXAMINATION  Initial Vital Signs Blood pressure (!) 133/57, pulse 89, temperature 98.6 F (37 C), temperature source Oral, resp. rate 20, height 5\' 10"  (1.778 m), weight 97.5 kg (215 lb), SpO2 97 %.  Examination General: Well-developed, well-nourished male  in no acute distress; appearance consistent with age of record HENT: normocephalic; atraumatic Eyes: pupils equal, round and reactive to light; extraocular muscles intact Neck: supple Heart: regular rate and rhythm; no murmur Lungs: clear to auscultation bilaterally Abdomen: soft; nondistended; nontender; bowel sounds present Extremities: No deformity; full range of motion; pulses normal Neurologic: Awake, alert and oriented; motor function intact in all extremities and symmetric; no facial droop Skin: Warm and dry Psychiatric: Normal mood and affect   RESULTS  Summary of this visit's  results, reviewed by myself:   EKG Interpretation  Date/Time:  Sunday November 16 2017 22:27:41 EST Ventricular Rate:  97 PR Interval:  200 QRS Duration: 90 QT Interval:  348 QTC Calculation: 441 R Axis:   94 Text Interpretation:  Normal sinus rhythm Rightward axis Borderline ECG No significant change was found Confirmed by Lolly Glaus ([id number]) on 11/16/2017 11:11:00 PM      Laboratory Studies: Results for orders placed or performed during the hospital encounter of 11/16/17 (from the past 24 hour(s))  Basic metabolic panel     Status: Abnormal   Collection Time: 11/16/17 10:43 PM  Result Value Ref Range   Sodium 136 135 - 145 mmol/L   Potassium 3.7 3.5 - 5.1 mmol/L   Chloride 104 101 - 111 mmol/L   CO2 23 22 - 32 mmol/L   Glucose, Bld 260 (H) 65 - 99 mg/dL   BUN 17 6 - 20 mg/dL   Creatinine, Ser 6.041.03 0.61 - 1.24 mg/dL   Calcium 9.1 8.9 - 54.010.3 mg/dL   GFR calc non Af Amer >60 >60 mL/min   GFR calc Af Amer >60 >60 mL/min   Anion gap 9 5 - 15  CBC     Status: None   Collection Time: 11/16/17 10:43 PM  Result Value Ref Range   WBC 9.0 4.0 - 10.5 K/uL   RBC 4.71 4.22 - 5.81 MIL/uL   Hemoglobin 15.0 13.0 - 17.0 g/dL   HCT 98.144.3 19.139.0 - 47.852.0 %   MCV 94.1 78.0 - 100.0 fL   MCH 31.8 26.0 - 34.0 pg   MCHC 33.9 30.0 - 36.0 g/dL   RDW 29.512.3 62.111.5 - 30.815.5 %   Platelets 307 150 - 400 K/uL  Troponin I     Status: None   Collection Time: 11/16/17 10:43 PM  Result Value Ref Range   Troponin I <0.03 <0.03 ng/mL  Troponin I     Status: None   Collection Time: 11/17/17  1:45 AM  Result Value Ref Range   Troponin I <0.03 <0.03 ng/mL   Imaging Studies: Dg Chest 2 View  Result Date: 11/16/2017 CLINICAL DATA:  Chest pain. EXAM: CHEST  2 VIEW COMPARISON:  01/31/2016 and prior chest radiographs FINDINGS: Cardiomediastinal silhouette is unchanged and unremarkable. There is no evidence of focal airspace disease, pulmonary edema, suspicious pulmonary nodule/mass, pleural effusion, or  pneumothorax. No acute bony abnormalities are identified. IMPRESSION: No active cardiopulmonary disease. Electronically Signed   By: Harmon PierJeffrey  Hu M.D.   On: 11/16/2017 23:16    ED COURSE  Nursing notes and initial vitals signs, including pulse oximetry, reviewed.  Vitals:   11/17/17 0145 11/17/17 0200 11/17/17 0215 11/17/17 0230  BP: 116/63 127/61 120/64 116/68  Pulse: 69 72 71 73  Resp: 20 (!) 23 20 20   Temp:      TempSrc:      SpO2: 96% 97% 97% 95%  Weight:      Height:       2:42  AM Further monitoring reveals the patient is having rare PVCs.  The patient is not feeling these.  No other abnormal rhythms or beats were seen.  We will have him follow-up with cardiology for further evaluation of his palpitations.  PROCEDURES    ED DIAGNOSES     ICD-10-CM   1. PVC (premature ventricular contraction) I49.3        Onofre Gains, MD 11/17/17 [phone]

## 2017-11-16 NOTE — ED Triage Notes (Signed)
Patient states that he started to feel " bad" last night. The patient states that last night he became flush and felt like he was going to pass out. The patient states that he was also having palpitations. Th epatient states that he started to have the feeling again tonight about an hour ago he started to have the same feels   " not feeling right"

## 2017-11-17 LAB — TROPONIN I

## 2018-06-29 ENCOUNTER — Emergency Department (HOSPITAL_BASED_OUTPATIENT_CLINIC_OR_DEPARTMENT_OTHER)
Admission: EM | Admit: 2018-06-29 | Discharge: 2018-06-29 | Disposition: A | Discharge status: Home / Self Care | Payer: BLUE CROSS/BLUE SHIELD | Attending: Emergency Medicine | Admitting: Emergency Medicine

## 2018-06-29 ENCOUNTER — Other Ambulatory Visit: Payer: Self-pay

## 2018-06-29 ENCOUNTER — Emergency Department (HOSPITAL_BASED_OUTPATIENT_CLINIC_OR_DEPARTMENT_OTHER): Payer: BLUE CROSS/BLUE SHIELD

## 2018-06-29 ENCOUNTER — Encounter (HOSPITAL_BASED_OUTPATIENT_CLINIC_OR_DEPARTMENT_OTHER): Payer: Self-pay

## 2018-06-29 DIAGNOSIS — E119 Type 2 diabetes mellitus without complications: Secondary | ICD-10-CM | POA: Insufficient documentation

## 2018-06-29 DIAGNOSIS — I10 Essential (primary) hypertension: Secondary | ICD-10-CM | POA: Insufficient documentation

## 2018-06-29 DIAGNOSIS — R109 Unspecified abdominal pain: Secondary | ICD-10-CM | POA: Diagnosis present

## 2018-06-29 DIAGNOSIS — N23 Unspecified renal colic: Secondary | ICD-10-CM

## 2018-06-29 DIAGNOSIS — R112 Nausea with vomiting, unspecified: Secondary | ICD-10-CM | POA: Diagnosis not present

## 2018-06-29 DIAGNOSIS — Z79899 Other long term (current) drug therapy: Secondary | ICD-10-CM | POA: Insufficient documentation

## 2018-06-29 DIAGNOSIS — J45909 Unspecified asthma, uncomplicated: Secondary | ICD-10-CM | POA: Insufficient documentation

## 2018-06-29 DIAGNOSIS — N201 Calculus of ureter: Secondary | ICD-10-CM | POA: Diagnosis not present

## 2018-06-29 DIAGNOSIS — R1030 Lower abdominal pain, unspecified: Secondary | ICD-10-CM | POA: Diagnosis not present

## 2018-06-29 DIAGNOSIS — N202 Calculus of kidney with calculus of ureter: Secondary | ICD-10-CM | POA: Diagnosis not present

## 2018-06-29 LAB — COMPREHENSIVE METABOLIC PANEL
ALK PHOS: 45 U/L (ref 38–126)
ALT: 23 U/L (ref 0–44)
ANION GAP: 13 (ref 5–15)
AST: 21 U/L (ref 15–41)
Albumin: 4.4 g/dL (ref 3.5–5.0)
BILIRUBIN TOTAL: 0.7 mg/dL (ref 0.3–1.2)
BUN: 19 mg/dL (ref 6–20)
CALCIUM: 9 mg/dL (ref 8.9–10.3)
CO2: 21 mmol/L — AB (ref 22–32)
Chloride: 105 mmol/L (ref 98–111)
Creatinine, Ser: 1.19 mg/dL (ref 0.61–1.24)
GFR calc Af Amer: >60 mL/min (ref >60)
GFR calc non Af Amer: >60 mL/min (ref >60)
GLUCOSE: 163 mg/dL — AB (ref 70–99)
POTASSIUM: 3.9 mmol/L (ref 3.5–5.1)
SODIUM: 139 mmol/L (ref 135–145)
Total Protein: 7.9 g/dL (ref 6.5–8.1)

## 2018-06-29 LAB — CBC WITH DIFFERENTIAL/PLATELET
Basophils Absolute: 0 10*3/uL (ref 0.0–0.1)
Basophils Relative: 0 %
EOS PCT: 3 %
Eosinophils Absolute: 0.3 10*3/uL (ref 0.0–0.7)
HEMATOCRIT: 42.9 % (ref 39.0–52.0)
Hemoglobin: 14.6 g/dL (ref 13.0–17.0)
LYMPHS PCT: 16 %
Lymphs Abs: 2.1 10*3/uL (ref 0.7–4.0)
MCH: 31.9 pg (ref 26.0–34.0)
MCHC: 34 g/dL (ref 30.0–36.0)
MCV: 93.9 fL (ref 78.0–100.0)
MONO ABS: 0.7 10*3/uL (ref 0.1–1.0)
Monocytes Relative: 5 %
NEUTROS ABS: 9.9 10*3/uL — AB (ref 1.7–7.7)
Neutrophils Relative %: 76 %
PLATELETS: 271 10*3/uL (ref 150–400)
RBC: 4.57 MIL/uL (ref 4.22–5.81)
RDW: 12.2 % (ref 11.5–15.5)
WBC: 13 10*3/uL — ABNORMAL HIGH (ref 4.0–10.5)

## 2018-06-29 MED ORDER — HYDROCODONE-ACETAMINOPHEN 5-325 MG PO TABS: 1.0000 | ORAL_TABLET | Freq: Four times a day (QID) | ORAL | 0 refills | Status: DC | PRN

## 2018-06-29 MED ORDER — ONDANSETRON 4 MG PO TBDP: 4.0000 mg | ORAL_TABLET | Freq: Three times a day (TID) | ORAL | 1 refills | Status: DC | PRN

## 2018-06-29 MED ORDER — IBUPROFEN 800 MG PO TABS: 800.0000 mg | ORAL_TABLET | Freq: Three times a day (TID) | ORAL | 0 refills | Status: AC

## 2018-06-29 MED ORDER — HYDROMORPHONE HCL 1 MG/ML IJ SOLN
1.0000 mg | Freq: Once | INTRAMUSCULAR | Status: AC
  Administered 2018-06-29: 1 mg via INTRAVENOUS
  Filled 2018-06-29: qty 1

## 2018-06-29 MED ORDER — ONDANSETRON HCL 4 MG/2ML IJ SOLN
4.0000 mg | Freq: Once | INTRAMUSCULAR | Status: AC | PRN
  Administered 2018-06-29: 4 mg via INTRAVENOUS
  Filled 2018-06-29: qty 2

## 2018-06-29 MED ORDER — FENTANYL CITRATE (PF) 100 MCG/2ML IJ SOLN
50.0000 ug | INTRAMUSCULAR | Status: DC | PRN
  Administered 2018-06-29: 50 ug via INTRAVENOUS
  Filled 2018-06-29: qty 2

## 2018-06-29 MED ORDER — SODIUM CHLORIDE 0.9 % IV SOLN
INTRAVENOUS | Status: DC
  Administered 2018-06-29: 20:00:00 via INTRAVENOUS

## 2018-06-29 NOTE — ED Notes (Signed)
Will keep pt NPO at this time, instructed pt and significant other not to allow pt to eat or drink

## 2018-06-29 NOTE — ED Provider Notes (Signed)
[facility] EMERGENCY DEPARTMENT Provider Note   CSN: [id number] Arrival date & time: 06/29/18  1754     History   Chief Complaint Chief Complaint  Patient presents with  . Flank Pain    HPI Luke Rodriguez is a 52 y.o. male.  Patient with acute onset left flank pain at 1300 today.  Reminded him of past kidney stones.  Patient noted a little bit of maturity in his urine yesterday.  Patient did have nausea and vomiting with this.     Past Medical History:  Diagnosis Date  . Anxiety   . Asthma   . Kidney stones   . Type II diabetes mellitus [facility](HCC)     Patient Active Problem List   Diagnosis Date Noted  . Diabetes mellitus type 2, uncomplicated (HCC)   . Essential hypertension   . Chest pain 02/23/2015  . Asthma, chronic 02/23/2015  . Diabetes (HCC) 02/23/2015    Past Surgical History:  Procedure Laterality Date  . CYSTOSCOPY WITH STENT PLACEMENT  2013   "2 stents"  . EXTRACORPOREAL SHOCK WAVE LITHOTRIPSY  2013  . VASECTOMY          Home Medications    Prior to Admission medications   Medication Sig Start Date End Date Taking? Authorizing Provider  albuterol (PROVENTIL HFA;VENTOLIN HFA) 108 (90 BASE) MCG/ACT inhaler Inhale 1-2 puffs into the lungs 2 (two) times daily as needed for wheezing or shortness of breath.     [provider]  citalopram (CELEXA) 20 MG tablet Take 20 mg by mouth daily.    [provider]  Dulaglutide (TRULICITY) 1.5 MG/0.5ML SOPN Inject into the skin.    [provider]  HYDROcodone-acetaminophen (NORCO/VICODIN) 5-325 MG tablet Take 1-2 tablets by mouth every 6 (six) hours as needed for moderate pain. 06/29/18   Vanetta MuldersZackowski, Devonna Oboyle, MD  ibuprofen (ADVIL,MOTRIN) 800 MG tablet Take 1 tablet (800 mg total) by mouth 3 (three) times daily. 06/29/18   Vanetta MuldersZackowski, Jayden Kratochvil, MD  KOMBIGLYZE XR 2.04-999 MG TB24 Take 2 tablets by mouth daily.  02/22/15   [provider]  Lancets Letta Pate(ONETOUCH ULTRASOFT) lancets   02/22/15   [provider]  lisinopril (PRINIVIL,ZESTRIL) 10 MG tablet Take 10 mg by mouth daily.    [provider]  metFORMIN (GLUCOPHAGE) 1000 MG tablet Take 1,000 mg by mouth 2 (two) times daily with a meal.    [provider]  ondansetron (ZOFRAN ODT) 4 MG disintegrating tablet Take 1 tablet (4 mg total) by mouth every 8 (eight) hours as needed. 06/29/18   Vanetta MuldersZackowski, Secilia Apps, MD  ONE TOUCH ULTRA TEST test strip  02/22/15   [provider]    Family History No family history on file.  Social History Social History   Tobacco Use  . Smoking status: Never Smoker  . Smokeless tobacco: Never Used  Substance Use Topics  . Alcohol use: No  . Drug use: No     Allergies   Singulair [montelukast] and Budesonide-formoterol fumarate   Review of Systems Review of Systems  Constitutional: Negative for fever.  HENT: Negative for congestion.   Eyes: Negative for redness.  Respiratory: Negative for shortness of breath.   Cardiovascular: Negative for chest pain.  Gastrointestinal: Positive for nausea and vomiting.  Genitourinary: Positive for flank pain and hematuria.  Musculoskeletal: Negative for back pain.  Skin: Negative for rash.  Neurological: Negative for headaches.  Hematological: Does not bruise/bleed easily.  Psychiatric/Behavioral: Negative for confusion.     Physical Exam Updated  Vital Signs BP 133/73 (BP Location: Right Arm)   Pulse (!) 58   Temp 98.1 F (36.7 C) (Oral)   Resp 16   Ht 1.778 m (5\' 10" )   Wt 97.5 kg (215 lb)   SpO2 97%   BMI 30.85 kg/m   Physical Exam  Constitutional: He is oriented to person, place, and time. He appears well-developed and well-nourished. No distress.  HENT:  Head: Normocephalic and atraumatic.  Mouth/Throat: Oropharynx is clear and moist.  Eyes: Pupils are equal, round, and reactive to light. Conjunctivae and EOM are normal.  Neck: Normal range of motion. Neck supple.  Cardiovascular: Normal  rate and regular rhythm.  Pulmonary/Chest: Effort normal and breath sounds normal.  Abdominal: Soft. Bowel sounds are normal. There is no tenderness.  Musculoskeletal: Normal range of motion.  Neurological: He is alert and oriented to person, place, and time. No cranial nerve deficit or sensory deficit. He exhibits normal muscle tone. Coordination normal.  Skin: Skin is warm.  Nursing note and vitals reviewed.    ED Treatments / Results  Labs (all labs ordered are listed, but only abnormal results are displayed) Labs Reviewed  COMPREHENSIVE METABOLIC PANEL - Abnormal; Notable for the following components:      Result Value   CO2 21 (*)    Glucose, Bld 163 (*)    All other components within normal limits  CBC WITH DIFFERENTIAL/PLATELET - Abnormal; Notable for the following components:   WBC 13.0 (*)    Neutro Abs 9.9 (*)    All other components within normal limits  URINALYSIS, ROUTINE W REFLEX MICROSCOPIC    EKG None  Radiology Ct Renal Stone Study  Result Date: 06/29/2018 CLINICAL DATA:  Flank pain. Suspect kidney stone. History of nephrolithiasis, diabetes. EXAM: CT ABDOMEN AND PELVIS WITHOUT CONTRAST TECHNIQUE: Multidetector CT imaging of the abdomen and pelvis was performed following the standard protocol without IV contrast. COMPARISON:  CT abdomen and pelvis July 07, 2012 FINDINGS: LOWER CHEST: RIGHT dependent atelectasis. The visualized heart size is normal. No pericardial effusion. HEPATOBILIARY: The liver is diffusely hypodense compatible with steatosis, mild focal fatty sparing about the gallbladder fossa. Normal gallbladder. PANCREAS: Normal. SPLEEN: Normal. ADRENALS/URINARY TRACT: Kidneys are orthotopic, demonstrating normal size and morphology. Similarly located 5 mm proximal LEFT ureteral calculus results in mild LEFT hydronephrosis. Mild nonspecific LEFT perinephric fat stranding. 3 mm LEFT lower pole nephrolithiasis. 3 mm RIGHT upper pole and punctate RIGHT lower pole  nephrolithiasis. Limited assessment for renal masses by nonenhanced CT. The unopacified ureters are normal in course and caliber. Urinary bladder is partially distended and unremarkable. Normal adrenal glands. STOMACH/BOWEL: Very small hiatal hernia. The stomach, small and large bowel are normal in course and caliber without inflammatory changes, sensitivity decreased by lack of enteric contrast. Mild colonic diverticulosis. Normal appendix. VASCULAR/LYMPHATIC: Aortoiliac vessels are normal in course and caliber. No lymphadenopathy by CT size criteria. REPRODUCTIVE: Mild prostatomegaly. OTHER: No intraperitoneal free fluid or free air. MUSCULOSKELETAL: Non-acute. Moderate lower lumbar facet arthropathy. IMPRESSION: 1. 5 mm proximal LEFT ureteral calculus resulting in mild obstructive uropathy. 2. Bilateral nephrolithiasis measuring to 3 mm. Electronically Signed   By: Awilda Metro M.D.   On: 06/29/2018 20:24    Procedures Procedures (including critical care time)  Medications Ordered in ED Medications  fentaNYL (SUBLIMAZE) injection 50 mcg (50 mcg Intravenous Given 06/29/18 1814)  0.9 %  sodium chloride infusion ( Intravenous New Bag/Given 06/29/18 2008)  HYDROmorphone (DILAUDID) injection 1 mg (has no administration in time range)  ondansetron [facility]) injection 4 mg (4 mg Intravenous Given 06/29/18 1814)  HYDROmorphone (DILAUDID) injection 1 mg (1 mg Intravenous Given 06/29/18 2008)     Initial Impression / Assessment and Plan / ED Course  I have reviewed the triage vital signs and the nursing notes.  Pertinent labs & imaging results that were available during my care of the patient were reviewed by me and considered in my medical decision making (see chart for details).     Patient noted a history of several kidney stones.  CT scan shows a left-sided proximal ureter 5 mm stone.  It is followed by urologist.  Patient's pain improved here.  No acute kidney injury or any significant  urinalysis abnormalities.  Patient will be treated with hydrocodone Zofran and Motrin and follow-up with urology.  Patient's pain improved here with IV hydromorphone.  Final Clinical Impressions(s) / ED Diagnoses   Final diagnoses:  Ureteral colic  Left flank pain    ED Discharge Orders        Ordered    HYDROcodone-acetaminophen (NORCO/VICODIN) 5-325 MG tablet  Every 6 hours PRN     06/29/18 2046    ondansetron (ZOFRAN ODT) 4 MG disintegrating tablet  Every 8 hours PRN     06/29/18 2046    ibuprofen (ADVIL,MOTRIN) 800 MG tablet  3 times daily     06/29/18 2046       Vanetta Mulders, MD 06/29/18 2051

## 2018-06-29 NOTE — Discharge Instructions (Signed)
Call your urologist in the morning for follow-up.  Take the hydrocodone as directed.  Take the Motrin as directed.  Take the Zofran as needed for nausea and vomiting.  Return for any new or worse symptoms.  CT scan showed a 5 mm left-sided proximal ureter kidney stone.

## 2018-06-29 NOTE — ED Triage Notes (Signed)
Pt presents with left sided flank pain- N/V- hx of kidney stones

## 2018-07-07 DIAGNOSIS — K760 Fatty (change of) liver, not elsewhere classified: Secondary | ICD-10-CM | POA: Diagnosis not present

## 2018-07-07 DIAGNOSIS — N1330 Unspecified hydronephrosis: Secondary | ICD-10-CM | POA: Diagnosis not present

## 2018-07-07 DIAGNOSIS — N400 Enlarged prostate without lower urinary tract symptoms: Secondary | ICD-10-CM | POA: Diagnosis not present

## 2018-07-07 DIAGNOSIS — R110 Nausea: Secondary | ICD-10-CM | POA: Diagnosis not present

## 2018-07-07 DIAGNOSIS — Z87442 Personal history of urinary calculi: Secondary | ICD-10-CM | POA: Diagnosis not present

## 2018-07-07 DIAGNOSIS — R109 Unspecified abdominal pain: Secondary | ICD-10-CM | POA: Diagnosis not present

## 2018-07-07 DIAGNOSIS — N132 Hydronephrosis with renal and ureteral calculous obstruction: Secondary | ICD-10-CM | POA: Diagnosis not present

## 2018-07-21 DIAGNOSIS — N201 Calculus of ureter: Secondary | ICD-10-CM | POA: Diagnosis not present

## 2018-07-21 DIAGNOSIS — Z87442 Personal history of urinary calculi: Secondary | ICD-10-CM | POA: Diagnosis not present

## 2018-07-21 DIAGNOSIS — R109 Unspecified abdominal pain: Secondary | ICD-10-CM | POA: Diagnosis not present

## 2018-07-21 DIAGNOSIS — R1032 Left lower quadrant pain: Secondary | ICD-10-CM | POA: Diagnosis not present

## 2018-10-27 ENCOUNTER — Emergency Department (HOSPITAL_BASED_OUTPATIENT_CLINIC_OR_DEPARTMENT_OTHER): Payer: BLUE CROSS/BLUE SHIELD

## 2018-10-27 ENCOUNTER — Other Ambulatory Visit: Payer: Self-pay

## 2018-10-27 ENCOUNTER — Encounter (HOSPITAL_BASED_OUTPATIENT_CLINIC_OR_DEPARTMENT_OTHER): Payer: Self-pay | Admitting: Emergency Medicine

## 2018-10-27 ENCOUNTER — Emergency Department (HOSPITAL_BASED_OUTPATIENT_CLINIC_OR_DEPARTMENT_OTHER)
Admission: EM | Admit: 2018-10-27 | Discharge: 2018-10-27 | Disposition: A | Discharge status: Home / Self Care | Payer: BLUE CROSS/BLUE SHIELD | Attending: Emergency Medicine | Admitting: Emergency Medicine

## 2018-10-27 DIAGNOSIS — J45909 Unspecified asthma, uncomplicated: Secondary | ICD-10-CM | POA: Insufficient documentation

## 2018-10-27 DIAGNOSIS — R0789 Other chest pain: Secondary | ICD-10-CM | POA: Diagnosis not present

## 2018-10-27 DIAGNOSIS — I10 Essential (primary) hypertension: Secondary | ICD-10-CM | POA: Diagnosis not present

## 2018-10-27 DIAGNOSIS — R0602 Shortness of breath: Secondary | ICD-10-CM | POA: Insufficient documentation

## 2018-10-27 DIAGNOSIS — Z7984 Long term (current) use of oral hypoglycemic drugs: Secondary | ICD-10-CM | POA: Insufficient documentation

## 2018-10-27 DIAGNOSIS — E119 Type 2 diabetes mellitus without complications: Secondary | ICD-10-CM | POA: Diagnosis not present

## 2018-10-27 DIAGNOSIS — R079 Chest pain, unspecified: Secondary | ICD-10-CM | POA: Diagnosis not present

## 2018-10-27 LAB — CBC WITH DIFFERENTIAL/PLATELET
Abs Immature Granulocytes: 0.02 10*3/uL (ref 0.00–0.07)
BASOS PCT: 0 %
Basophils Absolute: 0 10*3/uL (ref 0.0–0.1)
EOS ABS: 0.2 10*3/uL (ref 0.0–0.5)
Eosinophils Relative: 2 %
HCT: 45.3 % (ref 39.0–52.0)
Hemoglobin: 15.2 g/dL (ref 13.0–17.0)
Immature Granulocytes: 0 %
Lymphocytes Relative: 28 %
Lymphs Abs: 2.5 10*3/uL (ref 0.7–4.0)
MCH: 31.5 pg (ref 26.0–34.0)
MCHC: 33.6 g/dL (ref 30.0–36.0)
MCV: 93.8 fL (ref 80.0–100.0)
MONO ABS: 0.8 10*3/uL (ref 0.1–1.0)
MONOS PCT: 9 %
NEUTROS PCT: 61 %
Neutro Abs: 5.5 10*3/uL (ref 1.7–7.7)
PLATELETS: 254 10*3/uL (ref 150–400)
RBC: 4.83 MIL/uL (ref 4.22–5.81)
RDW: 11.9 % (ref 11.5–15.5)
WBC: 9.1 10*3/uL (ref 4.0–10.5)
nRBC: 0 % (ref 0.0–0.2)

## 2018-10-27 LAB — COMPREHENSIVE METABOLIC PANEL
ALT: 32 U/L (ref 0–44)
ANION GAP: 12 (ref 5–15)
AST: 21 U/L (ref 15–41)
Albumin: 4 g/dL (ref 3.5–5.0)
Alkaline Phosphatase: 50 U/L (ref 38–126)
BUN: 15 mg/dL (ref 6–20)
CHLORIDE: 100 mmol/L (ref 98–111)
CO2: 24 mmol/L (ref 22–32)
Calcium: 9.4 mg/dL (ref 8.9–10.3)
Creatinine, Ser: 0.88 mg/dL (ref 0.61–1.24)
Glucose, Bld: 285 mg/dL — ABNORMAL HIGH (ref 70–99)
POTASSIUM: 3.6 mmol/L (ref 3.5–5.1)
Sodium: 136 mmol/L (ref 135–145)
Total Bilirubin: 0.5 mg/dL (ref 0.3–1.2)
Total Protein: 7.5 g/dL (ref 6.5–8.1)

## 2018-10-27 LAB — TROPONIN I

## 2018-10-27 NOTE — ED Provider Notes (Signed)
TIME SEEN: 3:54 AM  CHIEF COMPLAINT: Chest pain  HPI: Patient is a 52 year old male with history of hypertension, diabetes, asthma who presents to the emergency department with chest pain.  States that symptoms started around 2:30 AM while he was at rest in his recliner.  States the pain was in the center of his chest and felt like a uncomfortable feeling.  No radiation of pain.  He had 2 episodes that lasted approximately 15 to 20 minutes each.  Now pain-free.  Did feel short of breath, nauseous and had some sweating of his palms.  No dizziness.  No known aggravating or alleviating factors.  No fever or cough.  No history of PE, DVT, exogenous estrogen use, recent fractures, surgery, trauma, hospitalization or prolonged travel. No lower extremity swelling or pain. No calf tenderness.  According to our records patient had a negative exercise stress test and normal stress echocardiogram in 2015 and normal cardiac CT in 2016.  No provocative testing since that time.  No known family history of coronary artery disease.  States he takes Crestor for prevention but no history of hyperlipidemia.  He is not a smoker.  States this feels different than his asthma attacks.  ROS: See HPI Constitutional: no fever  Eyes: no drainage  ENT: no runny nose   Cardiovascular:   chest pain  Resp:  SOB  GI: no vomiting GU: no dysuria Integumentary: no rash  Allergy: no hives  Musculoskeletal: no leg swelling  Neurological: no slurred speech ROS otherwise negative  PAST MEDICAL HISTORY/PAST SURGICAL HISTORY:  Past Medical History:  Diagnosis Date  . Anxiety   . Asthma   . Kidney stones   . Type II diabetes mellitus (HCC)     MEDICATIONS:  Prior to Admission medications   Medication Sig Start Date End Date Taking? Authorizing Provider  albuterol (PROVENTIL HFA;VENTOLIN HFA) 108 (90 BASE) MCG/ACT inhaler Inhale 1-2 puffs into the lungs 2 (two) times daily as needed for wheezing or shortness of breath.      [provider]  citalopram (CELEXA) 20 MG tablet Take 20 mg by mouth daily.    [provider]  Dulaglutide (TRULICITY) 1.5 MG/0.5ML SOPN Inject into the skin.    [provider]  HYDROcodone-acetaminophen (NORCO/VICODIN) 5-325 MG tablet Take 1-2 tablets by mouth every 6 (six) hours as needed for moderate pain. 06/29/18   Vanetta MuldersZackowski, Scott, MD  ibuprofen (ADVIL,MOTRIN) 800 MG tablet Take 1 tablet (800 mg total) by mouth 3 (three) times daily. 06/29/18   Vanetta MuldersZackowski, Scott, MD  KOMBIGLYZE XR 2.04-999 MG TB24 Take 2 tablets by mouth daily.  02/22/15   [provider]  Lancets Letta Pate(ONETOUCH ULTRASOFT) lancets  02/22/15   [provider]  lisinopril (PRINIVIL,ZESTRIL) 10 MG tablet Take 10 mg by mouth daily.    [provider]  metFORMIN (GLUCOPHAGE) 1000 MG tablet Take 1,000 mg by mouth 2 (two) times daily with a meal.    [provider]  ondansetron (ZOFRAN ODT) 4 MG disintegrating tablet Take 1 tablet (4 mg total) by mouth every 8 (eight) hours as needed. 06/29/18   Vanetta MuldersZackowski, Scott, MD  ONE TOUCH ULTRA TEST test strip  02/22/15   [provider]    ALLERGIES:  Allergies  Allergen Reactions  . Singulair [Montelukast]   . Budesonide-Formoterol Fumarate Palpitations    Other reaction(s): Other (See Comments) Irregular heart rate    SOCIAL HISTORY:  Social History   Tobacco Use  . Smoking status: Never Smoker  .  Smokeless tobacco: Never Used  Substance Use Topics  . Alcohol use: No    FAMILY HISTORY: No family history on file.  EXAM: BP 125/79 (BP Location: Right Arm)   Pulse 83   Temp 97.9 F (36.6 C) (Oral)   Resp 18   Ht 5\' 10"  (1.778 m)   Wt 99.8 kg   SpO2 96%   BMI 31.57 kg/m  CONSTITUTIONAL: Alert and oriented and responds appropriately to questions. Well-appearing; well-nourished HEAD: Normocephalic EYES: Conjunctivae clear, pupils appear equal, EOMI ENT: normal nose; moist mucous membranes NECK: Supple, no  meningismus, no nuchal rigidity, no LAD  CARD: RRR; S1 and S2 appreciated; no murmurs, no clicks, no rubs, no gallops RESP: Normal chest excursion without splinting or tachypnea; breath sounds clear and equal bilaterally; no wheezes, no rhonchi, no rales, no hypoxia or respiratory distress, speaking full sentences ABD/GI: Normal bowel sounds; non-distended; soft, non-tender, no rebound, no guarding, no peritoneal signs, no hepatosplenomegaly BACK:  The back appears normal and is non-tender to palpation, there is no CVA tenderness EXT: Normal ROM in all joints; non-tender to palpation; no edema; normal capillary refill; no cyanosis, no calf tenderness or swelling    SKIN: Normal color for age and race; warm; no rash NEURO: Moves all extremities equally PSYCH: The patient's mood and manner are appropriate. Grooming and personal hygiene are appropriate.  MEDICAL DECISION MAKING: Patient here with episode of chest pain.  Now currently chest pain-free.  EKG shows no ischemic changes.  He has no risk factors for PE and is not tachycardic, tachypneic or hypoxic.  Doubt dissection.  He is low risk for ACS with a heart score of 3.  Plan is to obtain 2 troponins in the emergency department and if his work-up here is negative and he is still asymptomatic that he will be discharged home with outpatient follow-up.  He is comfortable with this plan.  He has a PCP as well for follow-up.  ED PROGRESS: Patient is still pain-free.  He has had 2 troponins that have been negative.  Chest x-ray was been clear.  I feel he is safe to be discharged home and follow-up with his PCP closely for outpatient work-up.  He is comfortable with this plan.  We have discussed return precautions.   At this time, I do not feel there is any life-threatening condition present. I have reviewed and discussed all results (EKG, imaging, lab, urine as appropriate) and exam findings with patient/family. I have reviewed nursing notes and  appropriate previous records.  I feel the patient is safe to be discharged home without further emergent workup and can continue workup as an outpatient as needed. Discussed usual and customary return precautions. Patient/family verbalize understanding and are comfortable with this plan.  Outpatient follow-up has been provided if needed. All questions have been answered.      EKG Interpretation  Date/Time:  Tuesday October 27 2018 03:39:00 EST Ventricular Rate:  81 PR Interval:    QRS Duration: 92 QT Interval:  378 QTC Calculation: 439 R Axis:   95 Text Interpretation:  Sinus rhythm Prolonged PR interval Borderline right axis deviation Minimal ST elevation, inferior leads similar to EKG in 2018 No significant change since last tracing Confirmed by Ward, Baxter Hire [phone]) on 10/27/2018 3:46:59 AM         Ward, Layla Maw, DO 10/27/18 6045

## 2018-10-27 NOTE — Discharge Instructions (Addendum)
Your EKG, cardiac tests and chest xray were normal today.  Please follow-up closely with your primary care physician who can help schedule you for outpatient stress testing.

## 2018-10-27 NOTE — ED Triage Notes (Signed)
Pt presents with midsternal chest that started about an hour ago with nausea shortness of breath and midsternal pain

## 2018-12-04 DIAGNOSIS — E118 Type 2 diabetes mellitus with unspecified complications: Secondary | ICD-10-CM | POA: Diagnosis not present

## 2018-12-04 DIAGNOSIS — Z23 Encounter for immunization: Secondary | ICD-10-CM | POA: Diagnosis not present

## 2018-12-04 DIAGNOSIS — J45998 Other asthma: Secondary | ICD-10-CM | POA: Diagnosis not present

## 2018-12-04 DIAGNOSIS — Z760 Encounter for issue of repeat prescription: Secondary | ICD-10-CM | POA: Diagnosis not present

## 2019-01-06 DIAGNOSIS — E782 Mixed hyperlipidemia: Secondary | ICD-10-CM | POA: Diagnosis not present

## 2019-01-06 DIAGNOSIS — E1165 Type 2 diabetes mellitus with hyperglycemia: Secondary | ICD-10-CM | POA: Diagnosis not present

## 2019-01-06 DIAGNOSIS — I10 Essential (primary) hypertension: Secondary | ICD-10-CM | POA: Diagnosis not present

## 2019-01-06 DIAGNOSIS — K219 Gastro-esophageal reflux disease without esophagitis: Secondary | ICD-10-CM | POA: Diagnosis not present

## 2019-01-13 DIAGNOSIS — J101 Influenza due to other identified influenza virus with other respiratory manifestations: Secondary | ICD-10-CM | POA: Diagnosis not present

## 2019-01-14 DIAGNOSIS — S83272A Complex tear of lateral meniscus, current injury, left knee, initial encounter: Secondary | ICD-10-CM | POA: Diagnosis not present

## 2019-06-25 DIAGNOSIS — Z20828 Contact with and (suspected) exposure to other viral communicable diseases: Secondary | ICD-10-CM | POA: Diagnosis not present

## 2019-06-25 DIAGNOSIS — R0602 Shortness of breath: Secondary | ICD-10-CM | POA: Diagnosis not present

## 2019-09-06 DIAGNOSIS — E1165 Type 2 diabetes mellitus with hyperglycemia: Secondary | ICD-10-CM | POA: Diagnosis not present

## 2019-09-06 DIAGNOSIS — K219 Gastro-esophageal reflux disease without esophagitis: Secondary | ICD-10-CM | POA: Diagnosis not present

## 2019-09-06 DIAGNOSIS — E782 Mixed hyperlipidemia: Secondary | ICD-10-CM | POA: Diagnosis not present

## 2019-09-06 DIAGNOSIS — I10 Essential (primary) hypertension: Secondary | ICD-10-CM | POA: Diagnosis not present

## 2019-10-03 ENCOUNTER — Encounter (HOSPITAL_COMMUNITY): Payer: Self-pay | Admitting: Emergency Medicine

## 2019-10-03 ENCOUNTER — Emergency Department (HOSPITAL_COMMUNITY)
Admission: EM | Admit: 2019-10-03 | Discharge: 2019-10-03 | Disposition: A | Discharge status: Home / Self Care | Payer: BC Managed Care – PPO | Attending: Emergency Medicine | Admitting: Emergency Medicine

## 2019-10-03 ENCOUNTER — Emergency Department (HOSPITAL_COMMUNITY): Payer: BC Managed Care – PPO

## 2019-10-03 ENCOUNTER — Other Ambulatory Visit: Payer: Self-pay

## 2019-10-03 DIAGNOSIS — N23 Unspecified renal colic: Secondary | ICD-10-CM | POA: Insufficient documentation

## 2019-10-03 DIAGNOSIS — E119 Type 2 diabetes mellitus without complications: Secondary | ICD-10-CM | POA: Insufficient documentation

## 2019-10-03 DIAGNOSIS — R21 Rash and other nonspecific skin eruption: Secondary | ICD-10-CM | POA: Insufficient documentation

## 2019-10-03 DIAGNOSIS — I10 Essential (primary) hypertension: Secondary | ICD-10-CM | POA: Diagnosis not present

## 2019-10-03 DIAGNOSIS — R1031 Right lower quadrant pain: Secondary | ICD-10-CM | POA: Diagnosis present

## 2019-10-03 DIAGNOSIS — J45909 Unspecified asthma, uncomplicated: Secondary | ICD-10-CM | POA: Diagnosis not present

## 2019-10-03 DIAGNOSIS — Z7984 Long term (current) use of oral hypoglycemic drugs: Secondary | ICD-10-CM | POA: Insufficient documentation

## 2019-10-03 DIAGNOSIS — Z79899 Other long term (current) drug therapy: Secondary | ICD-10-CM | POA: Insufficient documentation

## 2019-10-03 DIAGNOSIS — R109 Unspecified abdominal pain: Secondary | ICD-10-CM | POA: Diagnosis not present

## 2019-10-03 LAB — URINALYSIS, ROUTINE W REFLEX MICROSCOPIC
Bacteria, UA: NONE SEEN
Bilirubin Urine: NEGATIVE
Glucose, UA: 150 mg/dL — AB
Ketones, ur: 5 mg/dL — AB
Leukocytes,Ua: NEGATIVE
Nitrite: NEGATIVE
Protein, ur: NEGATIVE mg/dL
Specific Gravity, Urine: 1.04 — ABNORMAL HIGH (ref 1.005–1.030)
pH: 5 (ref 5.0–8.0)

## 2019-10-03 LAB — COMPREHENSIVE METABOLIC PANEL
ALT: 30 U/L (ref 0–44)
AST: 25 U/L (ref 15–41)
Albumin: 4.2 g/dL (ref 3.5–5.0)
Alkaline Phosphatase: 38 U/L (ref 38–126)
Anion gap: 13 (ref 5–15)
BUN: 16 mg/dL (ref 6–20)
CO2: 24 mmol/L (ref 22–32)
Calcium: 9.3 mg/dL (ref 8.9–10.3)
Chloride: 101 mmol/L (ref 98–111)
Creatinine, Ser: 1.09 mg/dL (ref 0.61–1.24)
GFR calc Af Amer: >60 mL/min (ref >60)
GFR calc non Af Amer: >60 mL/min (ref >60)
Glucose, Bld: 215 mg/dL — ABNORMAL HIGH (ref 70–99)
Potassium: 4 mmol/L (ref 3.5–5.1)
Sodium: 138 mmol/L (ref 135–145)
Total Bilirubin: 0.4 mg/dL (ref 0.3–1.2)
Total Protein: 7.9 g/dL (ref 6.5–8.1)

## 2019-10-03 LAB — CBC
HCT: 47.2 % (ref 39.0–52.0)
Hemoglobin: 15.5 g/dL (ref 13.0–17.0)
MCH: 31.4 pg (ref 26.0–34.0)
MCHC: 32.8 g/dL (ref 30.0–36.0)
MCV: 95.7 fL (ref 80.0–100.0)
Platelets: 233 10*3/uL (ref 150–400)
RBC: 4.93 MIL/uL (ref 4.22–5.81)
RDW: 11.8 % (ref 11.5–15.5)
WBC: 8.5 10*3/uL (ref 4.0–10.5)
nRBC: 0 % (ref 0.0–0.2)

## 2019-10-03 LAB — LIPASE, BLOOD: Lipase: 64 U/L — ABNORMAL HIGH (ref 11–51)

## 2019-10-03 MED ORDER — HYDROCODONE-ACETAMINOPHEN 5-325 MG PO TABS: 1.0000 | ORAL_TABLET | Freq: Four times a day (QID) | ORAL | 0 refills | Status: AC | PRN

## 2019-10-03 MED ORDER — ONDANSETRON 4 MG PO TBDP: 4.0000 mg | ORAL_TABLET | Freq: Three times a day (TID) | ORAL | 0 refills | Status: AC | PRN

## 2019-10-03 MED ORDER — ONDANSETRON HCL 4 MG/2ML IJ SOLN
4.0000 mg | Freq: Once | INTRAMUSCULAR | Status: AC
  Administered 2019-10-03: 13:00:00 4 mg via INTRAVENOUS
  Filled 2019-10-03: qty 2

## 2019-10-03 MED ORDER — TAMSULOSIN HCL 0.4 MG PO CAPS: 0.4000 mg | ORAL_CAPSULE | Freq: Every day | ORAL | 0 refills | Status: AC

## 2019-10-03 MED ORDER — ONDANSETRON HCL 4 MG/2ML IJ SOLN
4.0000 mg | Freq: Once | INTRAMUSCULAR | Status: AC
  Administered 2019-10-03: 10:00:00 4 mg via INTRAVENOUS
  Filled 2019-10-03: qty 2

## 2019-10-03 MED ORDER — MORPHINE SULFATE (PF) 4 MG/ML IV SOLN
4.0000 mg | Freq: Once | INTRAVENOUS | Status: AC
  Administered 2019-10-03: 4 mg via INTRAVENOUS
  Filled 2019-10-03: qty 1

## 2019-10-03 MED ORDER — KETOROLAC TROMETHAMINE 30 MG/ML IJ SOLN
30.0000 mg | Freq: Once | INTRAMUSCULAR | Status: AC
  Administered 2019-10-03: 14:00:00 30 mg via INTRAVENOUS
  Filled 2019-10-03: qty 1

## 2019-10-03 MED ORDER — NAPROXEN 500 MG PO TABS: 500.0000 mg | ORAL_TABLET | Freq: Two times a day (BID) | ORAL | 0 refills | Status: AC | PRN

## 2019-10-03 MED ORDER — DOXYCYCLINE HYCLATE 100 MG PO CAPS: 100.0000 mg | ORAL_CAPSULE | Freq: Two times a day (BID) | ORAL | 0 refills | Status: AC

## 2019-10-03 MED ORDER — HYDROCODONE-ACETAMINOPHEN 5-325 MG PO TABS: 2.0000 | ORAL_TABLET | Freq: Four times a day (QID) | ORAL | 0 refills | Status: DC | PRN

## 2019-10-03 MED ORDER — SODIUM CHLORIDE 0.9 % IV BOLUS
1000.0000 mL | Freq: Once | INTRAVENOUS | Status: AC
  Administered 2019-10-03: 13:00:00 1000 mL via INTRAVENOUS

## 2019-10-03 MED ORDER — HYDROMORPHONE HCL 1 MG/ML IJ SOLN
0.5000 mg | Freq: Once | INTRAMUSCULAR | Status: AC
  Administered 2019-10-03: 0.5 mg via INTRAVENOUS
  Filled 2019-10-03: qty 1

## 2019-10-03 MED ORDER — SODIUM CHLORIDE (PF) 0.9 % IJ SOLN
INTRAMUSCULAR | Status: AC
  Filled 2019-10-03: qty 50

## 2019-10-03 MED ORDER — IOHEXOL 300 MG/ML  SOLN
100.0000 mL | Freq: Once | INTRAMUSCULAR | Status: AC | PRN
  Administered 2019-10-03: 100 mL via INTRAVENOUS

## 2019-10-03 NOTE — ED Provider Notes (Signed)
[facility] DEPT Provider Note   CSN: [id number] Arrival date & time: 10/03/19  8250     History   Chief Complaint Chief Complaint  Patient presents with   Abdominal Pain    HPI Luke Rodriguez is a 53 y.o. male with past medical history significant for type 2 diabetes on metformin and insulin, kidney stones, asthma, anxiety presents to emergency department today with chief complaint of abdominal pain.  Pain is located in the right lower quadrant.  Onset was acute starting approximately 4 hours prior to arrival.  Patient states he was lying in bed when he had sudden onset of sharp right lower quadrant pain.  He admits to associated nausea with 2 episodes of nonbloody nonbilious emesis.  He rates pain 8 out of 10 in severity.  He has not taken any medications for pain prior to arrival.  At pain onset he denies radiation however now reports pain is radiating to his right flank.  He denies similar pain with previous kidney stones. He had a bowel movement prior to arrival and denies blood in stool or loose stool. He has history of cystoscopy with stent placement and lithotripsy, no abdominal surgeries, does not drink alcohol.  He denies fever, chills, chest pain, shortness of breath, cough, urinary frequency, dysuria, gross hematuria.  Chart review shows his last kidney stone was in 06/2018.  He has seen [facility] urology and successfully passed stone without intervention.  Past Medical History:  Diagnosis Date   Anxiety    Asthma    Kidney stones    Type II diabetes mellitus [facility])     Patient Active Problem List   Diagnosis Date Noted   Diabetes mellitus type 2, uncomplicated ([place])    Essential hypertension    Chest pain 02/23/2015   Asthma, chronic 02/23/2015   Diabetes ([place]) 02/23/2015    Past Surgical History:  Procedure Laterality Date   CYSTOSCOPY WITH STENT PLACEMENT  2013   "2 stents"   EXTRACORPOREAL SHOCK WAVE LITHOTRIPSY   2013   VASECTOMY          Home Medications    Prior to Admission medications   Medication Sig Start Date End Date Taking? Authorizing Provider  albuterol (PROVENTIL HFA;VENTOLIN HFA) 108 (90 BASE) MCG/ACT inhaler Inhale 1-2 puffs into the lungs 2 (two) times daily as needed for wheezing or shortness of breath.    Yes [provider]  Dulaglutide (TRULICITY) 1.5 NL/9.7QB SOPN Inject 1.5 mg into the skin every 7 (seven) days.    Yes [provider]  Lancets (ONETOUCH ULTRASOFT) lancets  02/22/15  Yes [provider]  lisinopril (PRINIVIL,ZESTRIL) 10 MG tablet Take 10 mg by mouth daily.   Yes [provider]  metFORMIN (GLUCOPHAGE) 1000 MG tablet Take 1,000 mg by mouth 2 (two) times daily with a meal.   Yes [provider]  omeprazole (PRILOSEC) 20 MG capsule Take 20 mg by mouth daily. 07/20/19  Yes [provider]  ONE TOUCH ULTRA TEST test strip  02/22/15  Yes [provider]  rosuvastatin (CRESTOR) 10 MG tablet Take 10 mg by mouth daily. 09/06/19  Yes [provider]  ibuprofen (ADVIL,MOTRIN) 800 MG tablet Take 1 tablet (800 mg total) by mouth 3 (three) times daily. Patient not taking: Reported on 10/03/2019 06/29/18   Fredia Sorrow, MD    Family History No family history on file.  Social History Social History   Tobacco Use   Smoking status: Never Smoker  Smokeless tobacco: Never Used  Substance Use Topics   Alcohol use: No   Drug use: No     Allergies   Singulair [montelukast] and Budesonide-formoterol fumarate   Review of Systems Review of Systems  Constitutional: Negative for chills and fever.  HENT: Negative for congestion, rhinorrhea, sinus pressure and sore throat.   Eyes: Negative for pain and redness.  Respiratory: Negative for cough, shortness of breath and wheezing.   Cardiovascular: Negative for chest pain and palpitations.  Gastrointestinal: Positive for abdominal pain, nausea and  vomiting. Negative for constipation and diarrhea.  Genitourinary: Negative for dysuria.  Musculoskeletal: Negative for arthralgias, back pain, myalgias and neck pain.  Skin: Negative for rash and wound.  Neurological: Negative for dizziness, syncope, weakness, numbness and headaches.  Psychiatric/Behavioral: Negative for confusion.     Physical Exam Updated Vital Signs BP (!) 143/79    Pulse 64    Temp (!) 97.5 F (36.4 C) (Oral)    Resp 16    SpO2 99%   Physical Exam Vitals signs and nursing note reviewed.  Constitutional:      General: He is not in acute distress.    Appearance: He is not ill-appearing or toxic-appearing.  HENT:     Head: Normocephalic and atraumatic.     Right Ear: Tympanic membrane and external ear normal.     Left Ear: Tympanic membrane and external ear normal.     Nose: Nose normal.     Mouth/Throat:     Mouth: Mucous membranes are moist.     Pharynx: Oropharynx is clear.  Eyes:     General: No scleral icterus.       Right eye: No discharge.        Left eye: No discharge.     Extraocular Movements: Extraocular movements intact.     Conjunctiva/sclera: Conjunctivae normal.     Pupils: Pupils are equal, round, and reactive to light.  Neck:     Musculoskeletal: Normal range of motion.     Vascular: No JVD.  Cardiovascular:     Rate and Rhythm: Normal rate and regular rhythm.     Pulses: Normal pulses.          Radial pulses are 2+ on the right side and 2+ on the left side.     Heart sounds: Normal heart sounds.  Pulmonary:     Comments: Lungs clear to auscultation in all fields. Symmetric chest rise. No wheezing, rales, or rhonchi. Abdominal:     General: Bowel sounds are normal. There is no distension.     Palpations: Abdomen is soft.     Tenderness: There is abdominal tenderness in the right lower quadrant. There is no right CVA tenderness, left CVA tenderness, guarding or rebound. Negative signs include obturator sign.     Comments: No peritoneal  signs.  Musculoskeletal: Normal range of motion.  Skin:    General: Skin is warm and dry.     Capillary Refill: Capillary refill takes less than 2 seconds.  Neurological:     Mental Status: He is oriented to person, place, and time.     GCS: GCS eye subscore is 4. GCS verbal subscore is 5. GCS motor subscore is 6.     Comments: Fluent speech, no facial droop.  Psychiatric:        Behavior: Behavior normal.      ED Treatments / Results  Labs (all labs ordered are listed, but only abnormal results are displayed) Labs Reviewed  LIPASE, BLOOD -  Abnormal; Notable for the following components:      Result Value   Lipase 64 (*)    All other components within normal limits  COMPREHENSIVE METABOLIC PANEL - Abnormal; Notable for the following components:   Glucose, Bld 215 (*)    All other components within normal limits  CBC  URINALYSIS, ROUTINE W REFLEX MICROSCOPIC    EKG None  Radiology Ct Abdomen Pelvis W Contrast  Result Date: 10/03/2019 CLINICAL DATA:  53 year old male with history of acute abdominal pain. Suspected acute appendicitis. EXAM: CT ABDOMEN AND PELVIS WITH CONTRAST TECHNIQUE: Multidetector CT imaging of the abdomen and pelvis was performed using the standard protocol following bolus administration of intravenous contrast. CONTRAST:  100mL OMNIPAQUE IOHEXOL 300 MG/ML  SOLN COMPARISON:  CT the abdomen and pelvis 06/29/2018. FINDINGS: Lower chest: Unremarkable. Hepatobiliary: Mild diffuse low attenuation throughout the hepatic parenchyma suggestive of hepatic steatosis (difficult to confirm on today's contrast enhanced examination). No discrete cystic or solid hepatic lesions. No intra or extrahepatic biliary ductal dilatation. Gallbladder is normal in appearance. Pancreas: No pancreatic mass. No pancreatic ductal dilatation. No pancreatic or peripancreatic fluid collections or inflammatory changes. Spleen: Unremarkable. Adrenals/Urinary Tract: 5 mm calculus at the right  ureteropelvic junction with moderate proximal hydronephrosis. Additional 2 mm calculus in the interpolar collecting system of the right kidney. The appearance of the kidneys and bilateral adrenal glands is otherwise normal. No hydroureter. Urinary bladder is normal in appearance. Stomach/Bowel: Normal appearance of the stomach. No pathologic dilatation of small bowel or colon. Normal appendix. Vascular/Lymphatic: No significant atherosclerotic disease, aneurysm or dissection noted in the abdominal or pelvic vasculature. No lymphadenopathy identified in the abdomen or pelvis. Reproductive: Prostate gland and seminal vesicles are unremarkable in appearance. Other: No significant volume of ascites.  No pneumoperitoneum. Musculoskeletal: There are no aggressive appearing lytic or blastic lesions noted in the visualized portions of the skeleton. IMPRESSION: 1. Obstructive 5 mm calculus at the right ureteropelvic junction with moderate proximal hydronephrosis. 2. 2 mm nonobstructive calculus in the interpolar collecting system of the right kidney. 3. Normal appendix. 4. Probable hepatic steatosis. Electronically Signed   By: Trudie Reedaniel  Entrikin M.D.   On: 10/03/2019 12:42    Procedures Procedures (including critical care time)  Medications Ordered in ED Medications  sodium chloride (PF) 0.9 % injection (has no administration in time range)  ondansetron (ZOFRAN) injection 4 mg (4 mg Intravenous Given 10/03/19 1024)  morphine 4 MG/ML injection 4 mg (4 mg Intravenous Given 10/03/19 1144)  iohexol (OMNIPAQUE) 300 MG/ML solution 100 mL (100 mLs Intravenous Contrast Given 10/03/19 1202)  HYDROmorphone (DILAUDID) injection 0.5 mg (0.5 mg Intravenous Given 10/03/19 1254)  ondansetron (ZOFRAN) injection 4 mg (4 mg Intravenous Given 10/03/19 1253)  sodium chloride 0.9 % bolus 1,000 mL (1,000 mLs Intravenous New Bag/Given 10/03/19 1253)  ketorolac (TORADOL) 30 MG/ML injection 30 mg (30 mg Intravenous Given 10/03/19 1411)      Initial Impression / Assessment and Plan / ED Course  I have reviewed the triage vital signs and the nursing notes.  Pertinent labs & imaging results that were available during my care of the patient were reviewed by me and considered in my medical decision making (see chart for details).  Patient presents to the ED with complaints of abdominal pain. Patient nontoxic appearing, in no apparent distress, vitals WN. On exam patient tender to right lower quadrant, no peritoneal signs. No epigastric tenderness. Will evaluate with labs and CT A/P. DDX includes appendicitis, kidney stone, gastritis, constipation, biliary colic,  cholecystitis, Analgesics, anti-emetics, IVF administered.   Labs reviewed and grossly unremarkable. No leukocytosis, no anemia, no significant electrolyte derangements. LFTs, renal function WNL. Lipase slightly elevated to 64. Urinalysis without obvious infection. He is voiding without difficulty. No signs of sepsis.  CT A/P is negative for appendicitis.  However it shows an obstructive 5 mm stone at the right ureteropelvic junction with moderate proximal hydronephrosis as well as a 2 mm nonobstructive stone in the right kidney. On reassessment patient reports pain did not improve with morphine.  Pain significantly improved after toradol and dilaudid. He is tolerating PO intake while in the ED. When discussing results with patient I he brought up a rash on his left shin that is suggestive of cellulitis. He recently was prescribed keflex by pcp that improved rash, but after finishing the prescription rash returned. Will discharge with doxycycline and give resource list for local dermatologists for follow up.  The patient appears reasonably screened and/or stabilized for discharge and I doubt any other medical condition or other [facility] requiring further screening, evaluation, or treatment in the ED at this time prior to discharge. The patient is safe for discharge with strict  return precautions discussed.  Pt will be dc home with pain medications, flomax, zofran. I have reviewed the PDMP during this encounter.Pt has no recent prescriptions. Pt has been advised to follow up with urology.   Portions of this note were generated with Scientist, clinical (histocompatibility and immunogenetics). Dictation errors may occur despite best attempts at proofreading.      Final Clinical Impressions(s) / ED Diagnoses   Final diagnoses:  Ureteral colic  Rash    ED Discharge Orders         Ordered    doxycycline (VIBRAMYCIN) 100 MG capsule  2 times daily     10/03/19 1524    ondansetron (ZOFRAN ODT) 4 MG disintegrating tablet  Every 8 hours PRN     10/03/19 1524    HYDROcodone-acetaminophen (NORCO/VICODIN) 5-325 MG tablet  Every 6 hours PRN,   Status:  Discontinued     10/03/19 1524    tamsulosin (FLOMAX) 0.4 MG CAPS capsule  Daily after supper     10/03/19 1524    naproxen (NAPROSYN) 500 MG tablet  2 times daily PRN     10/03/19 1526    HYDROcodone-acetaminophen (NORCO/VICODIN) 5-325 MG tablet  Every 6 hours PRN     10/03/19 1527           Sherene Sires, PA-C 10/04/19 2951    Linwood Dibbles, MD 10/05/19 [phone]

## 2019-10-03 NOTE — ED Triage Notes (Signed)
Per pt, states he woke up with RLQ pain and nausea-

## 2019-10-03 NOTE — ED Notes (Signed)
Patient ambulatory to CT at this time.

## 2019-10-03 NOTE — ED Notes (Signed)
ED Provider at bedside. 

## 2019-10-03 NOTE — Discharge Instructions (Addendum)
You were seen in the emergency department and found to have a kidney stone.   The CT scan shows 5 mm stone at the right ureteropelvic junction and 2 mm stone in the right kidney.  We are sending you home with multiple medications to assist with passing the stone:   -Flomax-this is a medication to help pass the stone, it allows urine to exit the body more freely.  Please take this once daily with a meal.  -Naproxen 500 mg-this is a medication that will help with pain as well as passing the stone.  Please take this every 8 hours.  Take this with food as it can cause stomach upset and at worst stomach bleeding.  Do not take other NSAIDs such as Motrin, Aleve, Advil, Mobic, or ibuprofen with this medicine as they are similar and would propagate any potential side effects.   -Hydrocodone- this is a narcotic/controlled substance medication that has potential addicting qualities.  We recommend that you take 1 tablets every 6 hours as needed for severe pain.  Do not drive or operate heavy machinery when taking this medicine as it can be sedating. Do not drink alcohol or take other sedating medications when taking this medicine for safety reasons.  Please be aware this medicine has Tylenol in it (325 mg/tab) do not exceed the maximum dose of Tylenol in a day per over the counter recommendations should you decide to supplement with Tylenol over the counter.   -Zofran-this is an antinausea medication, you may take this every 8 hours as needed for nausea and vomiting, please allow the tablet to dissolve underneath of your tongue.   We have prescribed you new medication(s) today. Discuss the medications prescribed today with your pharmacist as they can have adverse effects and interactions with your other medicines including over the counter and prescribed medications. Seek medical evaluation if you start to experience new or abnormal symptoms after taking one of these medicines, seek care immediately if you start  to experience difficulty breathing, feeling of your throat closing, facial swelling, or rash as these could be indications of a more serious allergic reaction  Please follow-up with your urology group within 3-5 days. You have seen [facility] at [place]. The  office phone number is [phone]  Return to the ER for new or worsening symptoms including but not limited to worsening pain not controlled by these medicines, inability to keep fluids down, fever, or any other concerns that you may have.   -Prescription also sent to your pharmacy for doxycycline.  This is an antibiotic for your rash.  You can also try Benadryl for itching if needed.  Please take antibiotic as prescribed.   Please return to the ER for worsening rash, high fevers, difficulty breathing or other concerns.  [facility] Dermatologists:  [facility]  [place] [address]  [phone]   Dr. Michelene Gardener, MD  [place]  [phone]  [facility]  [place]  [phone]   [place]  [phone]  Lavonna Monarch MD  [place]  [phone]  Katrina Stack  [place]  [phone]  [place] Amy Y MD  [address]  [phone]  [place]  [address]  [phone]

## 2019-10-31 DIAGNOSIS — M791 Myalgia, unspecified site: Secondary | ICD-10-CM | POA: Diagnosis not present

## 2019-10-31 DIAGNOSIS — R519 Headache, unspecified: Secondary | ICD-10-CM | POA: Diagnosis not present

## 2019-10-31 DIAGNOSIS — Z20828 Contact with and (suspected) exposure to other viral communicable diseases: Secondary | ICD-10-CM | POA: Diagnosis not present

## 2019-10-31 DIAGNOSIS — R05 Cough: Secondary | ICD-10-CM | POA: Diagnosis not present

## 2019-11-05 DIAGNOSIS — R05 Cough: Secondary | ICD-10-CM | POA: Diagnosis not present

## 2019-11-05 DIAGNOSIS — U071 COVID-19: Secondary | ICD-10-CM | POA: Diagnosis not present

## 2019-11-05 DIAGNOSIS — J4520 Mild intermittent asthma, uncomplicated: Secondary | ICD-10-CM | POA: Diagnosis not present

## 2020-01-07 IMAGING — CT CT RENAL STONE PROTOCOL
2 of 4 series · 16 of 46 positions shown, 18 images · non-contrast
Comparison: CT abdomen and pelvis July 07, 2012

CLINICAL DATA: Flank pain. Suspect kidney stone. History of
nephrolithiasis, diabetes.

EXAM:
CT ABDOMEN AND PELVIS WITHOUT CONTRAST
TECHNIQUE: Multidetector CT imaging of the abdomen and pelvis was performed
following the standard protocol without IV contrast.

[Series 2: axial st · axial · 0.90mm/px · z∈[+722,+1182]mm · 13 of 102 slices shown, 15 images]
[im 5/102  soft-tissue]
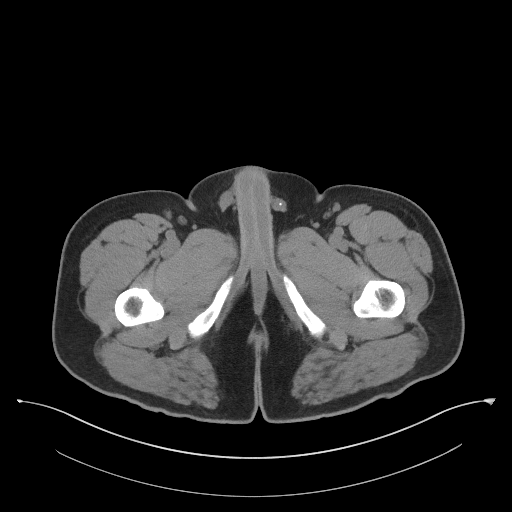
[im 5/102  bone]
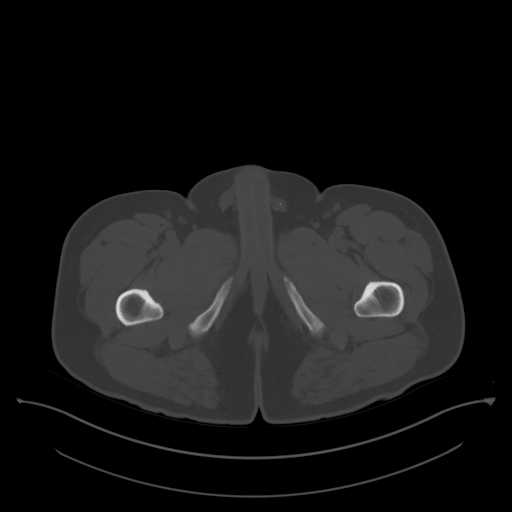
[im 13/102  soft-tissue]
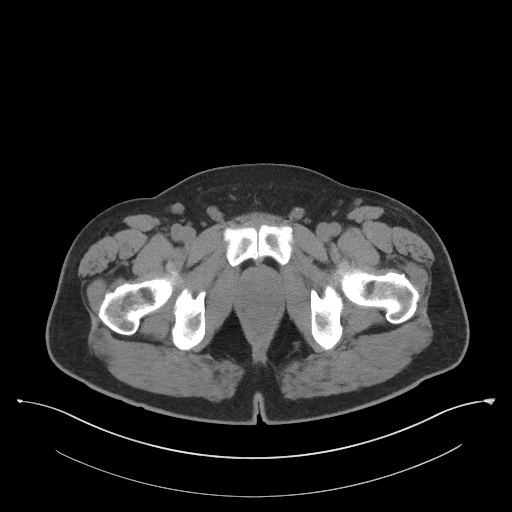
[im 21/102  soft-tissue]
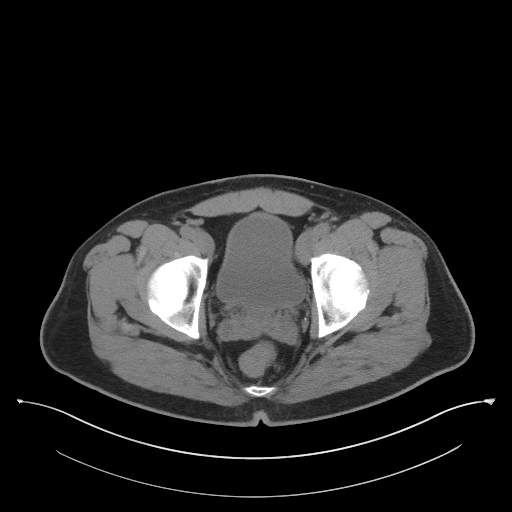
[im 29/102  soft-tissue]
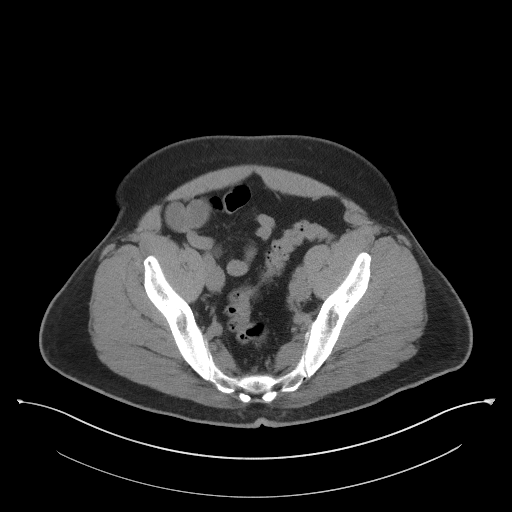
[im 37/102  soft-tissue]
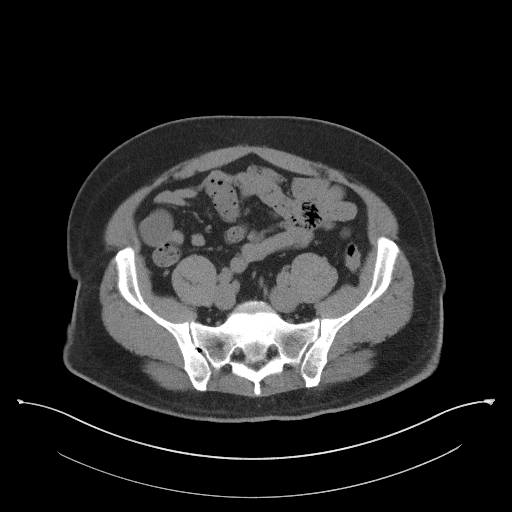
[im 45/102  soft-tissue]
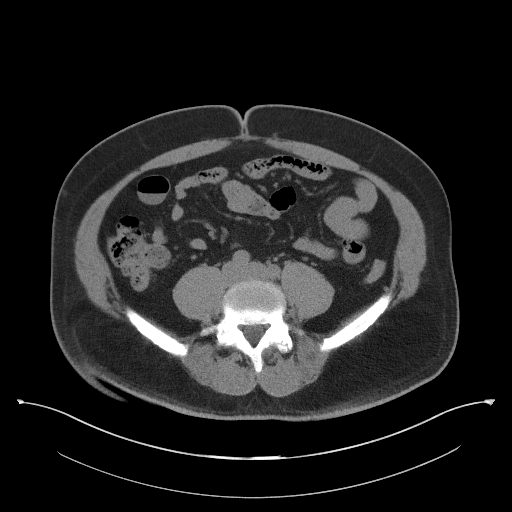
[im 53/102  soft-tissue]
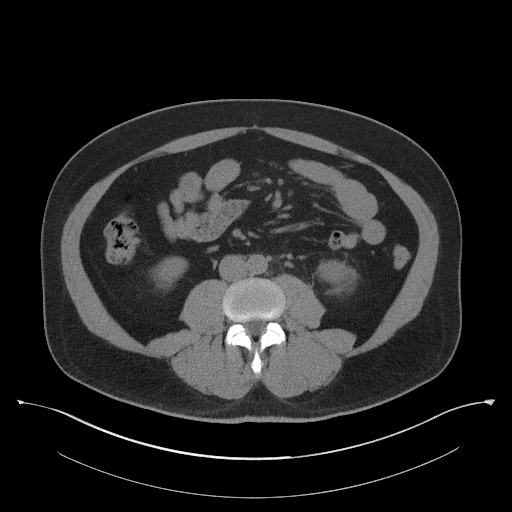
[im 57/102  soft-tissue]
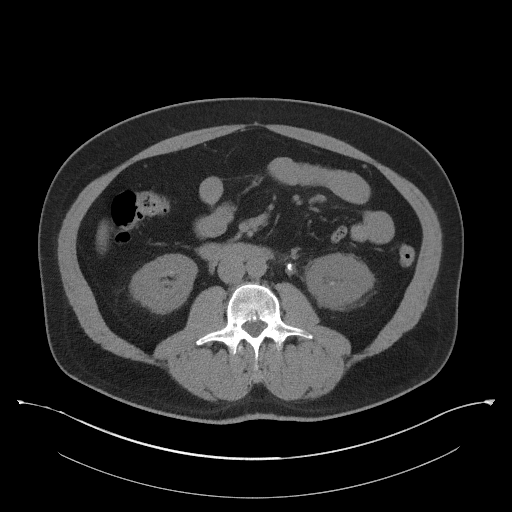
[im 65/102  soft-tissue]
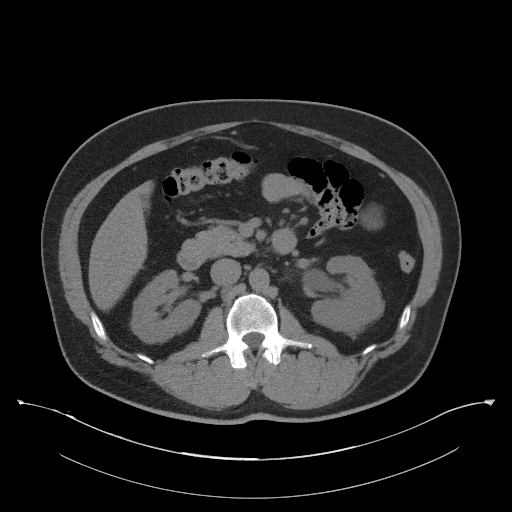
[im 65/102  bone]
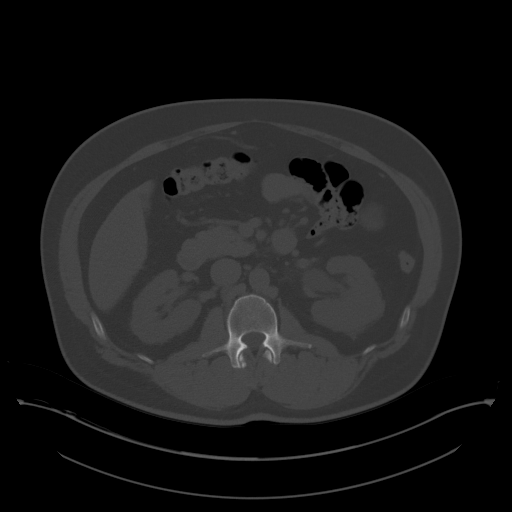
[im 73/102  soft-tissue]
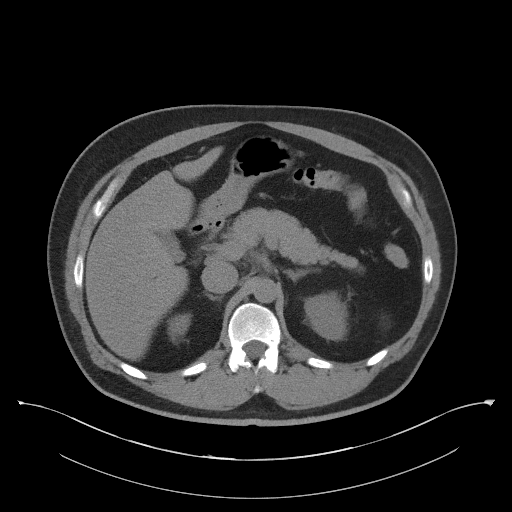
[im 81/102  soft-tissue]
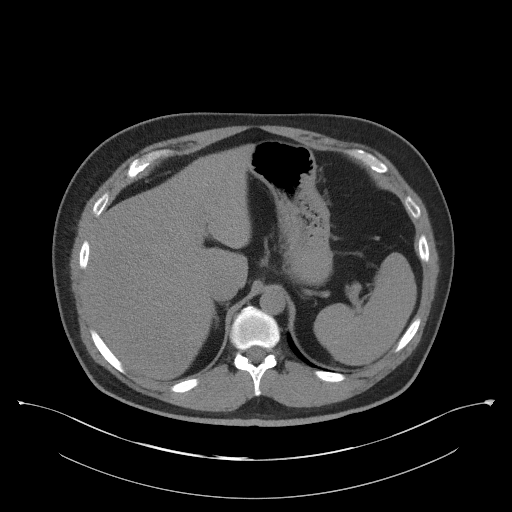
[im 89/102  soft-tissue]
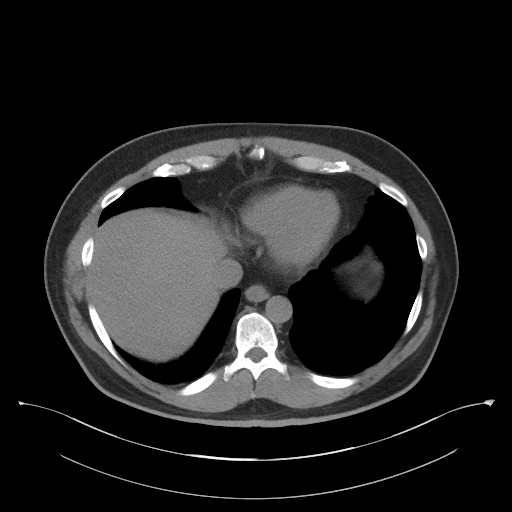
[im 97/102  soft-tissue]
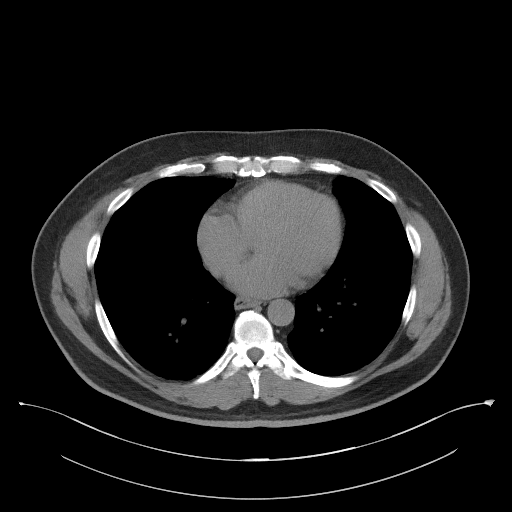

[Series 5: coronal st · coronal · 0.89mm/px · 3 of 94 slices shown]
[im 32/94  soft-tissue]
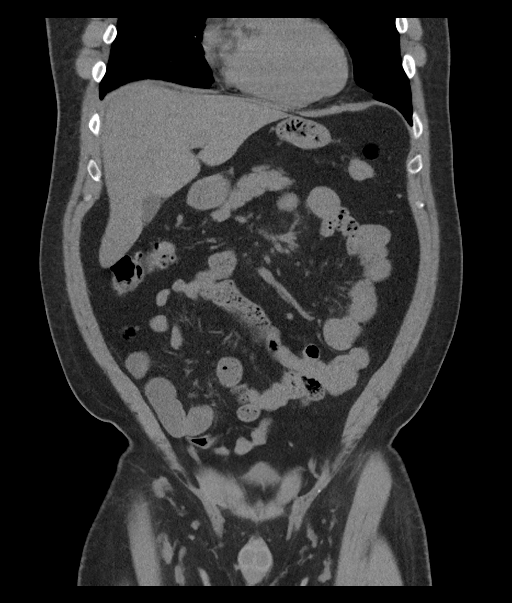
[im 42/94  soft-tissue]
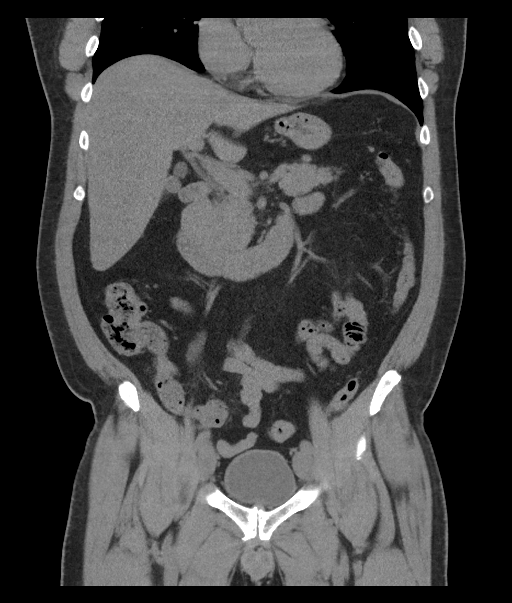
[im 52/94  soft-tissue]
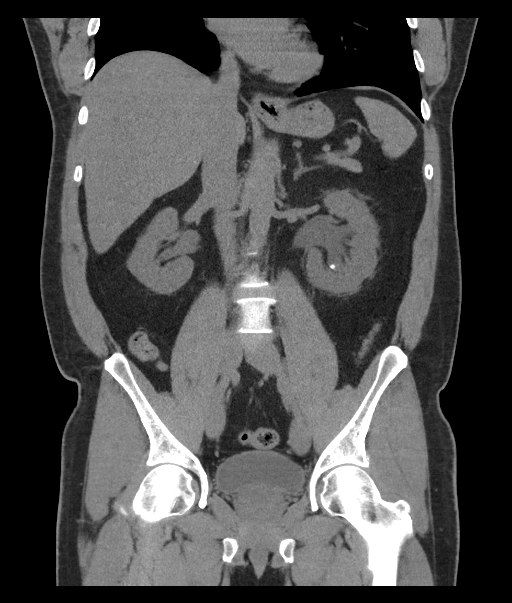

[16 of 46 positions shown; findings below may reference images not displayed]

FINDINGS: LOWER CHEST: RIGHT dependent atelectasis. The visualized heart size
is normal. No pericardial effusion.

HEPATOBILIARY: The liver is diffusely hypodense compatible with
steatosis, mild focal fatty sparing about the gallbladder fossa.
Normal gallbladder.

PANCREAS: Normal.

SPLEEN: Normal.

ADRENALS/URINARY TRACT: Kidneys are orthotopic, demonstrating normal
size and morphology. Similarly located 5 mm proximal LEFT ureteral
calculus results in mild LEFT hydronephrosis. Mild nonspecific LEFT
perinephric fat stranding. 3 mm LEFT lower pole nephrolithiasis. 3
mm RIGHT upper pole and punctate RIGHT lower pole nephrolithiasis.
Limited assessment for renal masses by nonenhanced CT. The
unopacified ureters are normal in course and caliber. Urinary
bladder is partially distended and unremarkable. Normal adrenal
glands.

STOMACH/BOWEL: Very small hiatal hernia. The stomach, small and
large bowel are normal in course and caliber without inflammatory
changes, sensitivity decreased by lack of enteric contrast. Mild
colonic diverticulosis. Normal appendix.

VASCULAR/LYMPHATIC: Aortoiliac vessels are normal in course and
caliber. No lymphadenopathy by CT size criteria.

REPRODUCTIVE: Mild prostatomegaly.

OTHER: No intraperitoneal free fluid or free air.

MUSCULOSKELETAL: Non-acute. Moderate lower lumbar facet arthropathy.
IMPRESSION: 1. 5 mm proximal LEFT ureteral calculus resulting in mild
obstructive uropathy.
2. Bilateral nephrolithiasis measuring to 3 mm.

## 2020-02-23 DIAGNOSIS — L309 Dermatitis, unspecified: Secondary | ICD-10-CM | POA: Diagnosis not present

## 2020-05-16 ENCOUNTER — Ambulatory Visit: Payer: Self-pay | Admitting: Family Medicine

## 2020-06-30 ENCOUNTER — Telehealth: Payer: Self-pay | Admitting: General Practice

## 2020-06-30 NOTE — Telephone Encounter (Signed)
Called patient to reschedule missed New appt from 05/16/20. Left voicemail for patient to call the office to schedule.

## 2022-05-30 ENCOUNTER — Other Ambulatory Visit: Payer: Self-pay | Admitting: Family Medicine

## 2022-05-30 DIAGNOSIS — R918 Other nonspecific abnormal finding of lung field: Secondary | ICD-10-CM

## 2022-07-09 ENCOUNTER — Ambulatory Visit
Admission: RE | Admit: 2022-07-09 | Discharge: 2022-07-09 | Disposition: A | Discharge status: Home / Self Care | Payer: Managed Care, Other (non HMO) | Source: Ambulatory Visit | Attending: Family Medicine | Admitting: Family Medicine

## 2022-07-09 DIAGNOSIS — R918 Other nonspecific abnormal finding of lung field: Secondary | ICD-10-CM

## 2022-07-09 MED ORDER — IOPAMIDOL (ISOVUE-300) INJECTION 61%
75.0000 mL | Freq: Once | INTRAVENOUS | Status: AC | PRN
  Administered 2022-07-09: 75 mL via INTRAVENOUS

## 2024-07-02 ENCOUNTER — Encounter: Payer: Self-pay | Admitting: Advanced Practice Midwife
